# Patient Record
Sex: Female | Born: 1955 | Race: White | Hispanic: No | Marital: Married | Smoking: Former smoker
Health system: Southern US, Community
[De-identification: ages and names within clinical notes are randomized; demographics above are authoritative.]

## PROBLEM LIST (undated history)

## (undated) DIAGNOSIS — M549 Dorsalgia, unspecified: Secondary | ICD-10-CM

## (undated) DIAGNOSIS — F444 Conversion disorder with motor symptom or deficit: Secondary | ICD-10-CM

## (undated) DIAGNOSIS — F32A Depression, unspecified: Secondary | ICD-10-CM

## (undated) DIAGNOSIS — I10 Essential (primary) hypertension: Secondary | ICD-10-CM

## (undated) DIAGNOSIS — E119 Type 2 diabetes mellitus without complications: Secondary | ICD-10-CM

## (undated) DIAGNOSIS — F419 Anxiety disorder, unspecified: Secondary | ICD-10-CM

## (undated) HISTORY — PX: COSMETIC SURGERY: SHX468

## (undated) HISTORY — DX: Anxiety disorder, unspecified: F41.9

## (undated) HISTORY — PX: ABDOMINAL SURGERY: SHX537

## (undated) HISTORY — DX: Depression, unspecified: F32.A

## (undated) HISTORY — DX: Type 2 diabetes mellitus without complications: E11.9

## (undated) HISTORY — DX: Essential (primary) hypertension: I10

## (undated) HISTORY — PX: CHOLECYSTECTOMY: SHX55

## (undated) HISTORY — PX: BREAST SURGERY: SHX581

## (undated) HISTORY — PX: OTHER SURGICAL HISTORY: SHX169

---

## 1999-10-04 ENCOUNTER — Emergency Department: Admit: 1999-10-04 | Payer: Self-pay | Source: Emergency Department

## 1999-10-04 ENCOUNTER — Emergency Department: Admit: 1999-10-04 | Payer: Self-pay | Admitting: Emergency Medicine

## 2003-03-10 ENCOUNTER — Emergency Department: Admit: 2003-03-10 | Payer: Self-pay | Source: Emergency Department

## 2006-02-13 DIAGNOSIS — F331 Major depressive disorder, recurrent, moderate: Secondary | ICD-10-CM | POA: Insufficient documentation

## 2006-03-06 DIAGNOSIS — F411 Generalized anxiety disorder: Secondary | ICD-10-CM | POA: Insufficient documentation

## 2006-03-06 DIAGNOSIS — Z13220 Encounter for screening for lipoid disorders: Secondary | ICD-10-CM | POA: Insufficient documentation

## 2006-03-06 DIAGNOSIS — Z136 Encounter for screening for cardiovascular disorders: Secondary | ICD-10-CM | POA: Insufficient documentation

## 2006-12-28 DIAGNOSIS — R635 Abnormal weight gain: Secondary | ICD-10-CM | POA: Insufficient documentation

## 2007-03-26 ENCOUNTER — Emergency Department: Admit: 2007-03-26 | Payer: Self-pay | Source: Emergency Department | Admitting: Emergency Medicine

## 2007-03-26 LAB — URINALYSIS WITH MICROSCOPIC
Bilirubin, UA: NEGATIVE
Glucose, UA: NEGATIVE
Ketones UA: NEGATIVE
Leukocyte Esterase, UA: NEGATIVE
Nitrite, UA: NEGATIVE
Protein, UR: NEGATIVE
Specific Gravity UA POCT: >=1.03 (ref <=1.030)
Urine pH: 5.5 (ref 5.0–8.0)
Urobilinogen, UA: 0.2

## 2007-12-14 ENCOUNTER — Emergency Department: Admit: 2007-12-14 | Payer: Self-pay | Source: Emergency Department | Admitting: Emergency Medicine

## 2008-01-31 DIAGNOSIS — J309 Allergic rhinitis, unspecified: Secondary | ICD-10-CM | POA: Insufficient documentation

## 2009-06-24 ENCOUNTER — Emergency Department: Admit: 2009-06-24 | Payer: Self-pay | Source: Emergency Department

## 2010-10-23 ENCOUNTER — Emergency Department
Admit: 2010-10-23 | Disposition: A | Discharge status: Home / Self Care | Payer: Self-pay | Source: Emergency Department | Admitting: Emergency Medicine

## 2011-03-10 DIAGNOSIS — R42 Dizziness and giddiness: Secondary | ICD-10-CM | POA: Insufficient documentation

## 2013-12-31 ENCOUNTER — Ambulatory Visit (INDEPENDENT_AMBULATORY_CARE_PROVIDER_SITE_OTHER): Payer: No Typology Code available for payment source | Admitting: Family Medicine

## 2013-12-31 ENCOUNTER — Encounter (INDEPENDENT_AMBULATORY_CARE_PROVIDER_SITE_OTHER): Payer: Self-pay | Admitting: Family Medicine

## 2013-12-31 VITALS — BP 167/108 | HR 86 | Temp 98.4°F | Resp 16 | Ht 66.5 in | Wt 180.0 lb

## 2013-12-31 DIAGNOSIS — B373 Candidiasis of vulva and vagina: Secondary | ICD-10-CM

## 2013-12-31 DIAGNOSIS — B3731 Acute candidiasis of vulva and vagina: Secondary | ICD-10-CM

## 2013-12-31 DIAGNOSIS — N898 Other specified noninflammatory disorders of vagina: Secondary | ICD-10-CM

## 2013-12-31 LAB — VAGINAL WET PREP POCT: POCT Trich Wet Prep: NONE SEEN

## 2013-12-31 LAB — VAGINAL KOH PREP POINT OF CARE: POCT KOH Prep: POSITIVE

## 2013-12-31 MED ORDER — FLUCONAZOLE 150 MG PO TABS
150.0000 mg | ORAL_TABLET | Freq: Once | ORAL | Status: AC
Start: 2013-12-31 — End: 2013-12-31

## 2013-12-31 NOTE — Patient Instructions (Signed)
You need to follow-up your BP in next few days    Take Diflucan today     Use baby powder and keep very dry    Follow-up with PCP

## 2013-12-31 NOTE — Progress Notes (Signed)
Subjective:       Patient ID: Krista Gray is a 58 y.o. female.    HPI  No tob  H/o seb cyst in vulva   Now c itchy mild [place]  Not sex active does not desire STI testing  The following portions of the patient's history were reviewed and updated as appropriate: allergies, current medications, past family history, past medical history, past social history, past surgical history and problem list.    Review of Systems  No f/c/HAs/runny nose/ST/ear pain/gland complaints/cough/sneezing/eye discharge  Last Pap ~2 yrs ago      Objective:     Physical Exam  BP 167/108 mmHg  Pulse 86  Temp(Src) 98.4 F (36.9 C) (Tympanic)  Resp 16  Ht 1.689 m (5' 6.5")  Wt 81.647 kg (180 lb)  BMI 28.62 kg/m2  Ery vulva inflam  Some swelling  No pain c procedure  Mild [place]          Assessment:       Yeast V.       Plan:       See avs and orders       Repeat BP 163/103

## 2013-12-31 NOTE — Progress Notes (Signed)
Patient is taking Vitamin D3 and uses probiotics

## 2014-01-09 DIAGNOSIS — G4700 Insomnia, unspecified: Secondary | ICD-10-CM | POA: Insufficient documentation

## 2014-12-06 ENCOUNTER — Ambulatory Visit (INDEPENDENT_AMBULATORY_CARE_PROVIDER_SITE_OTHER): Payer: No Typology Code available for payment source | Admitting: Internal Medicine

## 2014-12-06 ENCOUNTER — Encounter (INDEPENDENT_AMBULATORY_CARE_PROVIDER_SITE_OTHER): Payer: Self-pay

## 2014-12-06 VITALS — BP 115/78 | HR 88 | Temp 97.9°F | Resp 17 | Ht 67.0 in | Wt 186.6 lb

## 2014-12-06 DIAGNOSIS — L03114 Cellulitis of left upper limb: Secondary | ICD-10-CM

## 2014-12-06 DIAGNOSIS — W57XXXA Bitten or stung by nonvenomous insect and other nonvenomous arthropods, initial encounter: Secondary | ICD-10-CM

## 2014-12-06 MED ORDER — AMOXICILLIN-POT CLAVULANATE 875-125 MG PO TABS
1.0000 | ORAL_TABLET | Freq: Two times a day (BID) | ORAL | Status: AC
Start: 2014-12-06 — End: 2014-12-16

## 2014-12-06 NOTE — Patient Instructions (Signed)
Cellulitis  You have an infection of the skin known as cellulitis. This usually starts with a scrape, cut, insect bite, blister or other opening in the skin which becomes infected. This is a serious condition. It must be watched closely to be sure the infection is not spreading.  With antibiotic treatment, the size of the red area will gradually shrink in size until the skin returns to normal. This will take 7-10 days.  The red area should never increase in size once the antibiotic medicine has been started. Occasionally, an infection will be resistant to one antibiotic and another one will have to be used.  Home Care:  1) Limit the use of the affected part, since excess movement can cause the infection to spread.  2) If the infection is on your leg, walk as little as possible during the first few days of the treatment. Keep your leg elevated while sitting. This will reduce swelling.  3) Take all of the antibiotic medicine exactly as directed until it is gone. Be careful not to miss any doses, especially during the first seven days.  Follow Up  with your doctor or this facility as directed. Check the infected area daily for the warning signs listed below.  Get Prompt Medical Attention  if any of the following occur:  -- Spreading area of redness  -- Increasing swelling or pain  -- Appearance of pus or drainage  -- Fever over 100.4 F (38.0 C) oral, or over 101.4 F (38.6 C) rectal, after two days on antibiotics   2000-2015 [employer] [address], [place] [address]. All rights reserved. This information is not intended as a substitute for professional medical care. Always follow your healthcare professional's instructions.

## 2014-12-06 NOTE — Progress Notes (Signed)
Subjective:       Patient ID: Krista Gray [place] is a 59 y.o. female.    HPI    Chief Complaint   Patient presents with   . Insect Bite     12/03/2014: Felt a tickle, scratched surface 12/04/2014: became swollen with redness and progressivly gotten worse 0730/2016: cortoze 10 topical application: some relief; tender to touch at the eye of rash & warm to touch,      Thinks she got a bug bite (not a sting, did not hurt) left forearm 2 days ago, became swollen and a little red yesterday, redness spread some today.  Some itching but no pain.  No drainage or bleeding from area of bite.  Did not see the bug that bit her.  No dyspnea or wheezing.  No lip or tongue swelling.  No known allergies.       Past Medical History   Diagnosis Date   . Hypertension    . Anxiety    . Depression    . Diabetes mellitus      Current Outpatient Prescriptions   Medication Sig Dispense Refill   . FLUoxetine (PROZAC) 20 MG capsule Take 20 mg by mouth daily.     . metFORMIN (GLUCOPHAGE) 850 MG tablet Take 500 mg by mouth 2 (two) times daily with meals.     . phentermine (ADIPEX-P) 37.5 MG tablet Take 37.5 mg by mouth every morning before breakfast.     . traZODone (DESYREL) 50 MG tablet Take 50 mg by mouth nightly.     Marland Kitchen UNABLE TO FIND Med Name: Patient states water pill       No current facility-administered medications for this visit.     Allergies   Allergen Reactions   . Other      MSG     Social History     Social History   . Marital Status: Married     Spouse Name: N/A   . Number of Children: N/A   . Years of Education: N/A     Occupational History   . Not on file.     Social History Main Topics   . Smoking status: Former Games developer   . Smokeless tobacco: Never Used   . Alcohol Use: No   . Drug Use: No   . Sexual Activity: Not on file     Other Topics Concern   . Not on file     Social History Narrative         Review of Systems  See hpi      Objective:     Physical Exam   Constitutional: She is oriented to person, place, and time. No  distress.   HENT:   No lip or tongue swelling   Eyes: Conjunctivae are normal. No scleral icterus.   Pulmonary/Chest: Effort normal.   Musculoskeletal:        Arms:  Neurological: She is alert and oriented to person, place, and time.   Skin: Skin is warm and dry. She is not diaphoretic.   Psychiatric: She has a normal mood and affect. Her speech is normal and behavior is normal.     BP 115/78 mmHg  Pulse 88  Temp(Src) 97.9 F (36.6 Gray) (Oral)  Resp 17  Ht 1.702 m (5\' 7" )  Wt 84.641 kg (186 lb 9.6 oz)  BMI 29.22 kg/m2  SpO2 97%        Assessment:       1. Cellulitis of left upper extremity  amoxicillin-clavulanate (AUGMENTIN) 875-125 MG per tablet   2. Insect bite  amoxicillin-clavulanate (AUGMENTIN) 875-125 MG per tablet           Plan:       Reviewed AVS in detail with patient.  Return prn.

## 2016-05-04 ENCOUNTER — Other Ambulatory Visit: Payer: Self-pay | Admitting: Family Medicine

## 2016-05-04 DIAGNOSIS — N649 Disorder of breast, unspecified: Secondary | ICD-10-CM

## 2016-05-05 ENCOUNTER — Ambulatory Visit: Payer: No Typology Code available for payment source | Attending: Family Medicine

## 2016-05-05 DIAGNOSIS — T8549XA Other mechanical complication of breast prosthesis and implant, initial encounter: Secondary | ICD-10-CM | POA: Insufficient documentation

## 2016-05-05 DIAGNOSIS — N649 Disorder of breast, unspecified: Secondary | ICD-10-CM

## 2016-05-05 MED ORDER — GADOBUTROL 1 MMOL/ML IV SOLN
8.5000 mL | Freq: Once | INTRAVENOUS | Status: AC | PRN
Start: 2016-05-05 — End: 2016-05-05
  Administered 2016-05-05: 8.5 mmol via INTRAVENOUS
  Filled 2016-05-05: qty 10

## 2016-06-23 ENCOUNTER — Ambulatory Visit: Payer: Self-pay

## 2016-06-28 LAB — LAB USE ONLY - HISTORICAL SURGICAL PATHOLOGY

## 2016-10-20 ENCOUNTER — Other Ambulatory Visit (INDEPENDENT_AMBULATORY_CARE_PROVIDER_SITE_OTHER): Payer: Self-pay

## 2016-10-21 LAB — CBC AND DIFFERENTIAL
Baso(Absolute): 59 cells/uL (ref 0–200)
Basophils: 0.9 %
Eosinophils Absolute: 205 cells/uL (ref 15–500)
Eosinophils: 3.1 %
Hematocrit: 44.2 % (ref 35.0–45.0)
Hemoglobin: 14.5 g/dL (ref 11.7–15.5)
Lymphocytes Absolute: 2488 cells/uL (ref 850–3900)
Lymphocytes: 37.7 %
MCH: 28.9 pg (ref 27.0–33.0)
MCHC: 32.8 g/dL (ref 32.0–36.0)
MCV: 88 fL (ref 80.0–100.0)
MPV: 9.9 fL (ref 7.5–12.5)
Monocytes Absolute: 673 cells/uL (ref 200–950)
Monocytes: 10.2 %
Neutrophils Absolute: 3175 cells/uL (ref 1500–7800)
Neutrophils: 48.1 %
Platelets: 261 10*3/uL (ref 140–400)
RBC: 5.02 10*6/uL (ref 3.80–5.10)
RDW: 14.3 % (ref 11.0–15.0)
WBC: 6.6 10*3/uL (ref 3.8–10.8)

## 2016-10-21 LAB — COMPREHENSIVE METABOLIC PANEL
ALT: 23 U/L (ref 6–29)
AST (SGOT): 17 U/L (ref 10–35)
Albumin/Globulin Ratio: 2 (calc) (ref 1.0–2.5)
Albumin: 4.3 g/dL (ref 3.6–5.1)
Alkaline Phosphatase: 71 U/L (ref 33–130)
BUN: 14 mg/dL (ref 7–25)
Bilirubin, Total: 0.4 mg/dL (ref 0.2–1.2)
CO2: 27 mmol/L (ref 20–31)
Calcium: 9.6 mg/dL (ref 8.6–10.4)
Chloride: 106 mmol/L (ref 98–110)
Creatinine: 0.76 mg/dL (ref 0.50–1.05)
EGFR African American: 100 mL/min/{1.73_m2} (ref >=60)
Globulin: 2.1 g/dL (calc) (ref 1.9–3.7)
Glucose: 96 mg/dL (ref 65–99)
NON-AFRICAN AMERICA EGFR: 86 mL/min/{1.73_m2} (ref >=60)
Potassium: 4.3 mmol/L (ref 3.5–5.3)
Protein, Total: 6.4 g/dL (ref 6.1–8.1)
Sodium: 141 mmol/L (ref 135–146)

## 2016-10-21 LAB — LIPID PANEL
Cholesterol / HDL Ratio: 2.3 (calc) (ref <5.0)
Cholesterol: 195 mg/dL (ref <200)
HDL: 83 mg/dL (ref >50)
LDL Calculated: 97 mg/dL (calc)
NON HDL CHOLESTEROL: 112 mg/dL (calc) (ref <130)
Triglycerides: 61 mg/dL (ref <150)

## 2016-10-21 LAB — TSH: TSH: 1.65 mIU/L (ref 0.40–4.50)

## 2017-04-20 ENCOUNTER — Observation Stay
Admission: AD | Admit: 2017-04-20 | Discharge: 2017-04-21 | Disposition: A | Discharge status: Home / Self Care | Payer: Commercial Managed Care - POS | Source: Ambulatory Visit | Attending: Family Medicine | Admitting: Family Medicine

## 2017-04-20 ENCOUNTER — Emergency Department: Payer: Commercial Managed Care - POS

## 2017-04-20 ENCOUNTER — Emergency Department
Admission: EM | Admit: 2017-04-20 | Discharge: 2017-04-20 | Disposition: A | Discharge status: Other Acute Inpatient Hospital | Payer: Commercial Managed Care - POS | Attending: Emergency Medicine | Admitting: Emergency Medicine

## 2017-04-20 DIAGNOSIS — I10 Essential (primary) hypertension: Secondary | ICD-10-CM | POA: Insufficient documentation

## 2017-04-20 DIAGNOSIS — N289 Disorder of kidney and ureter, unspecified: Secondary | ICD-10-CM

## 2017-04-20 DIAGNOSIS — Z9889 Other specified postprocedural states: Secondary | ICD-10-CM | POA: Insufficient documentation

## 2017-04-20 DIAGNOSIS — F32A Depression, unspecified: Secondary | ICD-10-CM

## 2017-04-20 DIAGNOSIS — D649 Anemia, unspecified: Secondary | ICD-10-CM

## 2017-04-20 DIAGNOSIS — R0902 Hypoxemia: Secondary | ICD-10-CM

## 2017-04-20 DIAGNOSIS — N179 Acute kidney failure, unspecified: Secondary | ICD-10-CM | POA: Insufficient documentation

## 2017-04-20 DIAGNOSIS — Z87891 Personal history of nicotine dependence: Secondary | ICD-10-CM | POA: Insufficient documentation

## 2017-04-20 DIAGNOSIS — Z6832 Body mass index (BMI) 32.0-32.9, adult: Secondary | ICD-10-CM | POA: Insufficient documentation

## 2017-04-20 DIAGNOSIS — R339 Retention of urine, unspecified: Principal | ICD-10-CM | POA: Insufficient documentation

## 2017-04-20 DIAGNOSIS — E669 Obesity, unspecified: Secondary | ICD-10-CM | POA: Insufficient documentation

## 2017-04-20 DIAGNOSIS — E119 Type 2 diabetes mellitus without complications: Secondary | ICD-10-CM | POA: Insufficient documentation

## 2017-04-20 LAB — CBC AND DIFFERENTIAL
Absolute NRBC: 0 10*3/uL
Basophils Absolute Automated: 0.02 10*3/uL (ref 0.00–0.20)
Basophils Automated: 0.2 %
Eosinophils Absolute Automated: 0.03 10*3/uL (ref 0.00–0.70)
Eosinophils Automated: 0.3 %
Hematocrit: 30.1 % — ABNORMAL LOW (ref 37.0–47.0)
Hgb: 9.9 g/dL — ABNORMAL LOW (ref 12.0–16.0)
Immature Granulocytes Absolute: 0.05 10*3/uL
Immature Granulocytes: 0.5 %
Lymphocytes Absolute Automated: 1.62 10*3/uL (ref 0.50–4.40)
Lymphocytes Automated: 15.4 %
MCH: 29.9 pg (ref 28.0–32.0)
MCHC: 32.9 g/dL (ref 32.0–36.0)
MCV: 90.9 fL (ref 80.0–100.0)
MPV: 9.9 fL (ref 9.4–12.3)
Monocytes Absolute Automated: 1.26 10*3/uL — ABNORMAL HIGH (ref 0.00–1.20)
Monocytes: 12 %
Neutrophils Absolute: 7.56 10*3/uL (ref 1.80–8.10)
Neutrophils: 71.6 %
Nucleated RBC: 0 /100 WBC (ref 0.0–1.0)
Platelets: 211 10*3/uL (ref 140–400)
RBC: 3.31 10*6/uL — ABNORMAL LOW (ref 4.20–5.40)
RDW: 15 % (ref 12–15)
WBC: 10.54 10*3/uL (ref 3.50–10.80)

## 2017-04-20 LAB — COMPREHENSIVE METABOLIC PANEL
ALT: 31 U/L (ref 0–55)
AST (SGOT): 33 U/L (ref 5–34)
Albumin/Globulin Ratio: 1.5 (ref 0.9–2.2)
Albumin: 3.4 g/dL — ABNORMAL LOW (ref 3.5–5.0)
Alkaline Phosphatase: 57 U/L (ref 37–106)
Anion Gap: 11 (ref 5.0–15.0)
BUN: 38.6 mg/dL — ABNORMAL HIGH (ref 7.0–19.0)
Bilirubin, Total: 0.7 mg/dL (ref 0.2–1.2)
CO2: 22 mEq/L (ref 22–29)
Calcium: 8.7 mg/dL (ref 8.5–10.5)
Chloride: 99 mEq/L — ABNORMAL LOW (ref 100–111)
Creatinine: 1.5 mg/dL — ABNORMAL HIGH (ref 0.6–1.0)
Globulin: 2.3 g/dL (ref 2.0–3.6)
Glucose: 132 mg/dL — ABNORMAL HIGH (ref 70–100)
Potassium: 5.2 mEq/L — ABNORMAL HIGH (ref 3.5–5.1)
Protein, Total: 5.7 g/dL — ABNORMAL LOW (ref 6.0–8.3)
Sodium: 132 mEq/L — ABNORMAL LOW (ref 136–145)

## 2017-04-20 LAB — URINALYSIS
Bilirubin, UA: NEGATIVE
Blood, UA: NEGATIVE
Glucose, UA: NEGATIVE
Ketones UA: NEGATIVE
Leukocyte Esterase, UA: NEGATIVE
Nitrite, UA: NEGATIVE
Protein, UR: NEGATIVE
Specific Gravity UA: >1.03 (ref 1.001–1.035)
Urine pH: 6 (ref 5.0–8.0)
Urobilinogen, UA: 0.2 mg/dL

## 2017-04-20 LAB — GFR: EGFR: 35.5

## 2017-04-20 MED ORDER — HEPARIN SODIUM (PORCINE) 5000 UNIT/ML IJ SOLN
5000.00 [IU] | Freq: Three times a day (TID) | INTRAMUSCULAR | Status: DC
Start: 2017-04-21 — End: 2017-04-21
  Administered 2017-04-21: 05:00:00 5000 [IU] via SUBCUTANEOUS
  Filled 2017-04-20: qty 1

## 2017-04-20 MED ORDER — PATIENT SUPPLIED NON FORMULARY
500.00 mg | Freq: Two times a day (BID) | Status: DC
Start: 2017-04-21 — End: 2017-04-21

## 2017-04-20 MED ORDER — FLUOXETINE HCL 20 MG PO CAPS
40.00 mg | ORAL_CAPSULE | Freq: Every day | ORAL | Status: DC
Start: 2017-04-21 — End: 2017-04-21
  Administered 2017-04-21: 09:00:00 40 mg via ORAL
  Filled 2017-04-20: qty 2

## 2017-04-20 MED ORDER — LACTATED RINGERS IV BOLUS
1000.00 mL | Freq: Once | INTRAVENOUS | Status: AC
Start: 2017-04-21 — End: 2017-04-21
  Administered 2017-04-21: 01:00:00 1000 mL via INTRAVENOUS

## 2017-04-20 MED ORDER — OXYCODONE HCL 5 MG PO TABS
5.00 mg | ORAL_TABLET | ORAL | Status: DC | PRN
Start: 2017-04-20 — End: 2017-04-21
  Administered 2017-04-20 – 2017-04-21 (×2): 5 mg via ORAL
  Filled 2017-04-20 (×3): qty 1

## 2017-04-20 MED ORDER — MORPHINE SULFATE 4 MG/ML IJ/IV SOLN (WRAP)
4.0000 mg | Freq: Once | Status: DC
Start: 2017-04-20 — End: 2017-04-20

## 2017-04-20 MED ORDER — ENOXAPARIN SODIUM 40 MG/0.4ML SC SOLN
40.00 mg | Freq: Every day | SUBCUTANEOUS | Status: DC
Start: 2017-04-21 — End: 2017-04-20

## 2017-04-20 MED ORDER — PATIENT SUPPLIED NON FORMULARY
500.00 mg | Freq: Two times a day (BID) | Status: DC
Start: 2017-04-21 — End: 2017-04-20

## 2017-04-20 MED ORDER — SODIUM CHLORIDE 0.9 % IV BOLUS
1000.0000 mL | Freq: Once | INTRAVENOUS | Status: AC
Start: 2017-04-20 — End: 2017-04-20
  Administered 2017-04-20: 1000 mL via INTRAVENOUS

## 2017-04-20 MED ORDER — ACETAMINOPHEN 325 MG PO TABS
650.00 mg | ORAL_TABLET | Freq: Four times a day (QID) | ORAL | Status: DC | PRN
Start: 2017-04-20 — End: 2017-04-21
  Administered 2017-04-21: 650 mg via ORAL
  Filled 2017-04-20: qty 2

## 2017-04-20 MED ORDER — ONDANSETRON HCL 4 MG/2ML IJ SOLN
4.00 mg | Freq: Three times a day (TID) | INTRAMUSCULAR | Status: DC
Start: 2017-04-21 — End: 2017-04-21

## 2017-04-20 MED ORDER — MORPHINE SULFATE 2 MG/ML IJ/IV SOLN (WRAP)
INTRAVENOUS | Status: AC
Start: 2017-04-20 — End: 2017-04-20
  Administered 2017-04-20: 4 mg
  Filled 2017-04-20: qty 2

## 2017-04-20 MED ORDER — ONDANSETRON HCL 4 MG PO TABS
4.00 mg | ORAL_TABLET | Freq: Three times a day (TID) | ORAL | Status: DC
Start: 2017-04-21 — End: 2017-04-21

## 2017-04-20 MED ORDER — NALOXONE HCL 0.4 MG/ML IJ SOLN (WRAP)
0.20 mg | INTRAMUSCULAR | Status: DC | PRN
Start: 2017-04-20 — End: 2017-04-21

## 2017-04-20 MED ORDER — TRAZODONE HCL 50 MG PO TABS
50.00 mg | ORAL_TABLET | Freq: Every evening | ORAL | Status: DC
Start: 2017-04-21 — End: 2017-04-21

## 2017-04-20 NOTE — H&P (Shared)
[facility] Practice Admission H&P  Physician Available 24 hours a day:  [place] Resident: [phone]  [place] Resident: [phone]  Office#: [phone]       Date Time: 04/20/17 5:40 PM  Patient Name: Krista Gray  Attending Physician: Rob Bunting, MD  Primary Care Physician: Lanell Persons, MD    CC: Urinary retention       Assessment:     Active Hospital Problems    Diagnosis   . Urinary retention   . Depression   . Obese       61 y.o. female PMHx significant for anxiety/depression, DM, HTN and obesity admitted with urinary retention and hypoxia after a tummy tuck.      Plan:   ***    1)  2) Chronic:       #FEN/GI     #PPX  - DVT  - GI    CODE STATUS: ***    History of Presenting Illness:   Krista Gray is a 61 y.o. female PMHx significant for *** who presents to the hospital with ***    ED COURSE:   VS:   LABS:   EKG:   IMAGING:   MEDS:    SPECIALISTS:          Past Medical History:     Past Medical History:   Diagnosis Date   . Anxiety    . Depression    . Diabetes mellitus    . Hypertension        Past Surgical History:     Past Surgical History:   Procedure Laterality Date   . ABDOMINAL SURGERY     . COSMETIC SURGERY         Family History:     Family History   Problem Relation Age of Onset   . Diabetes Mother    . Hypertension Mother    . Osteoporosis Mother        Social History:     Social History     Social History   . Marital status: Married     Spouse name: N/A   . Number of children: N/A   . Years of education: N/A     Social History Main Topics   . Smoking status: Former Games developer   . Smokeless tobacco: Never Used   . Alcohol use No   . Drug use: No   . Sexual activity: Not on file     Other Topics Concern   . Not on file     Social History Narrative   . No narrative on file       Allergies:     Allergies   Allergen Reactions   . Other      MSG       Medications:     Current/Home Medications    CEFADROXIL (DURICEF) 500 MG CAPSULE    TAKE ONE CAPSULE TWICE A DAY BY MOUTH  STARTING NIGHT OF SURGERY    FLUOXETINE (PROZAC) 20 MG CAPSULE    Take 20 mg by mouth daily.    ONDANSETRON (ZOFRAN-ODT) 8 MG DISINTEGRATING TABLET    PLEASE SEE ATTACHED FOR DETAILED DIRECTIONS    OXYCODONE-ACETAMINOPHEN (LYNOX) 10-300 MG PER TABLET    Take 1 tablet by mouth every 4 (four) hours as needed for Pain.    PHENTERMINE (ADIPEX-P) 37.5 MG TABLET    Take 37.5 mg by mouth every morning before breakfast.    TRAZODONE (DESYREL) 50 MG TABLET  Take 50 mg by mouth nightly.    UNABLE TO FIND    Med Name: Patient states water pill        Review of Systems:   All other systems were reviewed and are negative except: per HPI    Physical Exam:     VITAL SIGNS: Physical Exam:   Temp:  [98.6 F (37 C)] 98.6 F (37 C)  Heart Rate:  [89-114] 89  Resp Rate:  [16] 16  BP: (123-129)/(62-70) 123/62  Body mass index is 30.67 kg/m.    Intake/Output Summary (Last 24 hours) at 04/20/17 1740  Last data filed at 04/20/17 1701   Gross per 24 hour   Intake                0 ml   Output              750 ml   Net             -750 ml    GEN: alert and oriented x 3; NAD  HEENT: PERRL, EOMI, sclera anicteric, oropharynx clear without lesions, MMM  NECK: supple, no lymphadenopathy, no thyromegaly, no JVD, no bruits  CARDS: RRR, no murmurs, rubs or gallops  LUNGS: clear to auscultation bilaterally, without wheezing, rhonchi, or rales  ABD: soft, non-tender, non-distended; no palpable masses, no hepatosplenomegaly, normoactive bowel sounds, no rebound or guarding  EXT: no clubbing, cyanosis, or edema  NEURO: cranial nerves grossly intact, strength 5/5 in upper and lower extremities, sensation intact  SKIN: no rashes or lesions noted  ***           Labs Reviewed:     Results     Procedure Component Value Units Date/Time    Comprehensive metabolic panel [[id number]]  (Abnormal) Collected:  04/20/17 1550    Specimen:  Blood Updated:  04/20/17 1619     Glucose 132 (H) mg/dL      BUN 45.4 (H) mg/dL      Creatinine 1.5 (H) mg/dL      Sodium 098  (L) mEq/L      Potassium 5.2 (H) mEq/L      Chloride 99 (L) mEq/L      CO2 22 mEq/L      Calcium 8.7 mg/dL      Protein, Total 5.7 (L) g/dL      Albumin 3.4 (L) g/dL      AST (SGOT) 33 U/L      ALT 31 U/L      Alkaline Phosphatase 57 U/L      Bilirubin, Total 0.7 mg/dL      Globulin 2.3 g/dL      Albumin/Globulin Ratio 1.5     Anion Gap 11.0    GFR [[id number]] Collected:  04/20/17 1550     Updated:  04/20/17 1619     EGFR 35.5    UA with reflex to micro (pts  3 + yrs) [[id number]] Collected:  04/20/17 1605    Specimen:  Urine from Urine, Clean Catch Updated:  04/20/17 1613     Urine Type Catheterized, F     Color, UA Yellow     Clarity, UA Clear     Specific Gravity UA >1.030     Urine pH 6.0     Leukocyte Esterase, UA Negative     Nitrite, UA Negative     Protein, UR Negative     Glucose, UA Negative     Ketones UA Negative     Urobilinogen, UA  0.2 mg/dL      Bilirubin, UA Negative     Blood, UA Negative    CBC with differential [[id number]]  (Abnormal) Collected:  04/20/17 1550    Specimen:  Blood from Blood Updated:  04/20/17 1602     WBC 10.54 x10 3/uL      Hgb 9.9 (L) g/dL      Hematocrit 45.4 (L) %      Platelets 211 x10 3/uL      RBC 3.31 (L) x10 6/uL      MCV 90.9 fL      MCH 29.9 pg      MCHC 32.9 g/dL      RDW 15 %      MPV 9.9 fL      Neutrophils 71.6 %      Lymphocytes Automated 15.4 %      Monocytes 12.0 %      Eosinophils Automated 0.3 %      Basophils Automated 0.2 %      Immature Granulocyte 0.5 %      Nucleated RBC 0.0 /100 WBC      Neutrophils Absolute 7.56 x10 3/uL      Abs Lymph Automated 1.62 x10 3/uL      Abs Mono Automated 1.26 (H) x10 3/uL      Abs Eos Automated 0.03 x10 3/uL      Absolute Baso Automated 0.02 x10 3/uL      Absolute Immature Granulocyte 0.05 x10 3/uL      Absolute NRBC 0.00 x10 3/uL             Imaging Reviewed:     Xr Chest  Ap Portable    Result Date: 04/20/2017  No acute cardiopulmonary disease. Mickel Duhamel, MD 04/20/2017 4:55 PM        Signed by: Ignacia Bayley, MD   [place]:  [phone]  [place]: [phone]  Office #: [phone]     MW:[id number], Barbara Cower, MD

## 2017-04-20 NOTE — ED Notes (Signed)
pts ETA 1915

## 2017-04-20 NOTE — ED Provider Notes (Signed)
[name] H&P         CLINICAL SUMMARY          Diagnosis:    .     Final diagnoses:   Urinary retention   Renal insufficiency   Hypoxia   Anemia, unspecified type         MDM Notes:      Krista Gray is a 61 y.o. female with Acute urinary retention 1 day postop from abdominoplasty surgery.  Patient was also mildly hypoxic anemic and with moderate renal insufficiency, presumably due to dehydration.  Patient was admitted for IV hydration and further evaluation of hypoxia.      Disposition:         Inpatient Admit      ED Disposition     ED Disposition Condition Date/Time Comment    Admit to [facility]  Fri Apr 20, 2017  7:17 PM Accepting physician: Dr. Omar Person.          CASE ACUITY SUMMARY        Kidney problems~~~~~~~~~~  Acute Kidney injury (creatinine > 0.3 above baseline or 1.5 x baseline)    Respiratory problems~~~~~~~  Acute respiratory insufficiency manifested by:  Hypoxia      New Prescriptions    No medications on file                 CLINICAL INFORMATION        HPI:      Chief Complaint: Urinary Retention  .    Krista Gray [place] is a 62 y.o. female who  has a past medical history of Anxiety; Depression; Diabetes mellitus; and Hypertension. and  has a past surgical history that includes Abdominal surgery and Cosmetic surgery. who presents with urinary retention s/p abdominoplasty done yesterday. Pt notes she has not been able to urinate. Denies CP, Sob, fever or chills. Notes she is shaky. currently taking percocet. Sx moderate, constant.       History obtained from: Patient      ROS:      Positive and negative ROS elements as per HPI.  All other systems reviewed and negative.      Physical Exam:      Pulse (!) 114  BP 129/70  Resp 16  SpO2 93 %  Temp 98.6 F (37 Gray)    Constitutional: Vital signs reviewed. Tremulous.   Head: Normocephalic, atraumatic  Eyes: No conjunctival injection. No discharge.EOMI  ENT: Mucous membranes moist,Ears and throat wnl  Neck:  Normal range of motion. Non-tender.  Respiratory/Chest: Clear to auscultation. No respiratory distress.   Cardiovascular: Tachycardic rate and rhythm. No murmur.   Abdomen: Soft. No guarding. No masses or hepatosplenomegaly. 2 suprapubic catheters in place with bloody drainage. Erythema and bruising throughout abdomen extending to medial thighs proximally.  Genitourinary:   UpperExtremity:FROM,no abnormality noted  LowerExtremity: No edema. No cyanosis.FROM  Neurological: No focal motor deficits by observation. Speech normal. Memory normal.  Skin: Warm and dry. No rash.  Lymphatic: No cervical lymphadenopathy.  Psychiatric: Normal affect. Normal concentration.  Back  No CVAT,no abnormalities,  Full range of motion              PAST HISTORY        Primary Care Provider: Lanell Persons, MD        PMH/PSH:    .     Past Medical History:   Diagnosis Date   . Anxiety    . Depression    . Diabetes mellitus    .  Hypertension        She has a past surgical history that includes Abdominal surgery and Cosmetic surgery.      Social/Family History:      She reports that she has quit smoking. She has never used smokeless tobacco. She reports that she does not drink alcohol or use drugs.    Family History   Problem Relation Age of Onset   . Diabetes Mother    . Hypertension Mother    . Osteoporosis Mother          Listed Medications on Arrival:    .     Previous Medications    CEFADROXIL (DURICEF) 500 MG CAPSULE    TAKE ONE CAPSULE TWICE A DAY BY MOUTH STARTING NIGHT OF SURGERY    FLUOXETINE (PROZAC) 20 MG CAPSULE    Take 20 mg by mouth daily.    ONDANSETRON (ZOFRAN-ODT) 8 MG DISINTEGRATING TABLET    PLEASE SEE ATTACHED FOR DETAILED DIRECTIONS    OXYCODONE-ACETAMINOPHEN (LYNOX) 10-300 MG PER TABLET    Take 1 tablet by mouth every 4 (four) hours as needed for Pain.    PHENTERMINE (ADIPEX-P) 37.5 MG TABLET    Take 37.5 mg by mouth every morning before breakfast.    TRAZODONE (DESYREL) 50 MG TABLET    Take 50 mg by mouth nightly.     UNABLE TO FIND    Med Name: Patient states water pill      Allergies: She is allergic to other.            VISIT INFORMATION        Clinical Course in the ED:      ED Course as of Apr 20 1804   Fri Apr 20, 2017   1651 [facility] resident paged  [MU]   1733 Bed requested with Clydie Braun an [place] fair Thelma Barge  [MU]      ED Course User Index  [MU] Rob Bunting, MD   4:43 PM  Spoke with Dr. Paulina Fusi' office.   On-call physician will call back.    4:46 PM  Dr. Paulina Fusi agrees to admission.     5:30 PM  Spoke with [facility] resident, Dr. Annice Needy.   Will admit to med tele at [facility].      Medications Given in the ED:    .     ED Medication Orders     Start Ordered     Status Ordering Provider    04/20/17 1753 04/20/17 1752  morphine injection 4 mg  Once     Route: Intravenous  Ordered Dose: 4 mg     Last MAR action:  Not Given Cathi Roan A    04/20/17 1752 04/20/17 1752  morphine 2 mg/mL injection     Comments:  Created by cabinet override    Last MAR action:  Given     04/20/17 1547 04/20/17 1546  sodium chloride 0.9 % bolus 1,000 mL  Once     Route: Intravenous  Ordered Dose: 1,000 mL     Last MAR action:  New Bag Viviene Thurston A            Procedures:      Procedures      Interpretations:      O2 sat-           saturation: 93 %; Oxygen use: 3L NC; Interpretation: Hypoxic    Radiology -     interpreted by me with the following observations: CXR: NAD  RESULTS        Recent Lab Results:      Results     Procedure Component Value Units Date/Time    Comprehensive metabolic panel [[id number]]  (Abnormal) Collected:  04/20/17 1550    Specimen:  Blood Updated:  04/20/17 1619     Glucose 132 (H) mg/dL      BUN 62.1 (H) mg/dL      Creatinine 1.5 (H) mg/dL      Sodium 308 (L) mEq/L      Potassium 5.2 (H) mEq/L      Chloride 99 (L) mEq/L      CO2 22 mEq/L      Calcium 8.7 mg/dL      Protein, Total 5.7 (L) g/dL      Albumin 3.4 (L) g/dL      AST (SGOT) 33 U/L      ALT 31 U/L      Alkaline Phosphatase 57 U/L      Bilirubin, Total 0.7 mg/dL      Globulin 2.3  g/dL      Albumin/Globulin Ratio 1.5     Anion Gap 11.0    GFR [[id number]] Collected:  04/20/17 1550     Updated:  04/20/17 1619     EGFR 35.5    UA with reflex to micro (pts  3 + yrs) [[id number]] Collected:  04/20/17 1605    Specimen:  Urine from Urine, Clean Catch Updated:  04/20/17 1613     Urine Type Catheterized, F     Color, UA Yellow     Clarity, UA Clear     Specific Gravity UA >1.030     Urine pH 6.0     Leukocyte Esterase, UA Negative     Nitrite, UA Negative     Protein, UR Negative     Glucose, UA Negative     Ketones UA Negative     Urobilinogen, UA 0.2 mg/dL      Bilirubin, UA Negative     Blood, UA Negative    CBC with differential [[id number]]  (Abnormal) Collected:  04/20/17 1550    Specimen:  Blood from Blood Updated:  04/20/17 1602     WBC 10.54 x10 3/uL      Hgb 9.9 (L) g/dL      Hematocrit 02.7 (L) %      Platelets 211 x10 3/uL      RBC 3.31 (L) x10 6/uL      MCV 90.9 fL      MCH 29.9 pg      MCHC 32.9 g/dL      RDW 15 %      MPV 9.9 fL      Neutrophils 71.6 %      Lymphocytes Automated 15.4 %      Monocytes 12.0 %      Eosinophils Automated 0.3 %      Basophils Automated 0.2 %      Immature Granulocyte 0.5 %      Nucleated RBC 0.0 /100 WBC      Neutrophils Absolute 7.56 x10 3/uL      Abs Lymph Automated 1.62 x10 3/uL      Abs Mono Automated 1.26 (H) x10 3/uL      Abs Eos Automated 0.03 x10 3/uL      Absolute Baso Automated 0.02 x10 3/uL      Absolute Immature Granulocyte 0.05 x10 3/uL      Absolute NRBC 0.00 x10 3/uL  Radiology Results:      XR Chest  AP Portable   Final Result      No acute cardiopulmonary disease.      Mickel Duhamel, MD    04/20/2017 4:55 PM                  Scribe Attestation:      I was acting as a Neurosurgeon for Rob Bunting, MD on [employer] Gray  Treatment Team: Scribe: Ardeth Perfect, Go Sammie Bench    I am the first provider for this patient and I personally performed the services documented. Treatment Team: Scribe: Ardeth Perfect, Go Sammie Bench is scribing for me on [place]ace[place],Dorleen Gray. This  note and the patient instructions accurately reflect work and decisions made by me.  Rob Bunting, MD     *This note was generated by the Epic EMR system/ Dragon speech recognition and may contain inherent errors or omissions not intended by the user. Grammatical errors, random word insertions, deletions, pronoun errors and incomplete sentences are occasional consequences of this technology due to software limitations. Not all errors are caught or corrected. If there are questions or concerns about the content of this note or information contained within the body of this dictation they should be addressed directly with the author for clarification.                       Rob Bunting, MD  04/21/17 [phone]

## 2017-04-20 NOTE — H&P (Signed)
[facility] Practice Admission H&P  Physician Available 24 hours a day:  [place] Resident: [phone]  [place] Resident: [phone]  Office#: [phone]       Date Time: 04/20/17 8:28 PM  Patient Name: Krista Gray  Attending Physician: Doyle Askew, MD  Primary Care Physician: Lanell Persons, MD    CC: Urinary retention       Assessment:     Active Hospital Problems    Diagnosis   . Urinary retention   . Depression   . AKI (acute kidney injury)       61 y.o. female PMHx significant for depression, obesity, recent cosmetic abdomnoplasty, liposuction of back, legs, buttocks 04/19/17 admitted with Urinary retention, s/p foley placement in ER, and AKI.     Plan:     #AKI likely 2/2 obstructive (urinary retention can be opiod induced) versus prerenal  - s/p foley  - 1L LR bolus now  - BMP daily  - Pain control: Tylenol, PO oxycodone   - Would plan for void trial in AM, if pass, can likely [place] home    #Noted "crunchiness" palpated on R abdominal wall - crepitus versus expected post-surgical changes. With appropriate post-surgical changes.   - Since patient appears clinically nontoxic, with normal vitals, will hold on further imaging at this point  - Even if there is some subcutaneous air, more likely expected post-abdomnoplasty changes rather than perforated bowel.   - Can wear abdominal binder if patient can tolerate.     2) Chronic:   Depression - fluoxetine 40mg  daily, trazodone 75mg  qPM   Hx of shingles - not active now   Post surgical ppx - cont. Cefadroxil (husband will bring from home)    #FEN/GI   - 1L LR bolus now, then PO fluids, replete lytes, Diet regular    #PPX  - DVT: SCDs, SQH  - GI: Not indicated    CODE STATUS: FULL    DISPO:  Based on the information available on the day of this admission, patient will be admitted to service status:  Observation:patient is stable and improving.   Anticipated medical stability for discharge:Yellow - maybe tomorrow  Anticipated discharge  needs: pass Void trial, improving AKI    History of Presenting Illness:   Krista Gray is a 61 y.o. female PMHx significant for  depression, obesity, recent cosmetic abdomnoplasty and liposuction 04/19/17 admitted with Urinary retention, and shakiness. Patient underwent 5 hour procedure yesterday where she had a "tummy tuck", liposuction to back, inner and outer thighs, and buttocks bilaterally. Patient discharged home that day with percocet, of which she states she has taken about 4-6 tablets of in past 24 hours. +flatus, no BM yet (due to pain). Denies significant nausea, no vomiting. Decreased appetite. Denies fevers, chills. Feels she looks a little pale. Otherwise, ambulating without difficulty. The next day at 2pm (day of admission), she noticed she had not been able to urinate, had bladder distention, so she called her surgeon Dr. Paulina Fusi, who advised her to go to the ER for a catheter placement. In the ER, she was found to have a mild AKI, so was admitted. Currently, she no longer feels bladder distention, still with mild tremor that goes away when distracted.     ED COURSE:   VS: AF, HR 114, now 91, 92% on 2L, HDS   LABS: WBC 10.5, Hg 9.9, Cr 1.5, Na 132, K 5.2, wnl LFTs, normal UA   EKG: n/a    IMAGING: CXR  wnl   MEDS: 4mg  IV morphine, 1L NS bolus    SPECIALISTS:   Dr. Paulina Fusi - Plastic surgeon       Past Medical History:     Past Medical History:   Diagnosis Date   . Anxiety    . Depression    . Diabetes mellitus    . Hypertension        Past Surgical History:     Past Surgical History:   Procedure Laterality Date   . ABDOMINAL SURGERY     . COSMETIC SURGERY         Family History:     Family History   Problem Relation Age of Onset   . Diabetes Mother    . Hypertension Mother    . Osteoporosis Mother        Social History:     Social History     Social History   . Marital status: Married     Spouse name: N/A   . Number of children: N/A   . Years of education: N/A     Social History Main Topics   .  Smoking status: Former Games developer   . Smokeless tobacco: Never Used   . Alcohol use No   . Drug use: No   . Sexual activity: Not on file     Other Topics Concern   . Not on file     Social History Narrative   . No narrative on file       Allergies:     Allergies   Allergen Reactions   . Other      MSG       Medications:     Current Discharge Medication List      CONTINUE these medications which have NOT CHANGED    Details   cefadroxil (DURICEF) 500 MG capsule TAKE ONE CAPSULE TWICE A DAY BY MOUTH STARTING NIGHT OF SURGERY  Refills: 0      FLUoxetine (PROZAC) 20 MG capsule Take 20 mg by mouth daily.      ondansetron (ZOFRAN-ODT) 8 MG disintegrating tablet PLEASE SEE ATTACHED FOR DETAILED DIRECTIONS  Refills: 0      oxyCODONE-acetaminophen (LYNOX) 10-300 MG per tablet Take 1 tablet by mouth every 4 (four) hours as needed for Pain.      phentermine (ADIPEX-P) 37.5 MG tablet Take 37.5 mg by mouth every morning before breakfast.      traZODone (DESYREL) 50 MG tablet Take 50 mg by mouth nightly.      UNABLE TO FIND Med Name: Patient states water pill              Review of Systems:   All other systems were reviewed and are negative except: per HPI    Physical Exam:     VITAL SIGNS: Physical Exam:   Temp:  [98.6 F (37 C)-99.1 F (37.3 C)] 99.1 F (37.3 C)  Heart Rate:  [89-114] 90  Resp Rate:  [16-20] 20  BP: (116-129)/(57-70) 116/57  There is no height or weight on file to calculate BMI.  No intake or output data in the 24 hours ending 04/20/17 2028 GEN: alert and oriented x 3; NAD, appropriate discomfort given recent surgery.   HEENT: EOMI, sclera anicteric, MMM  NECK: no JVD  CARDS: RRR, no murmurs, rubs or gallops  LUNGS: clear to auscultation bilaterally, without wheezing, rhonchi, or rales  ABD: linear surgical incision along lower abdomen, c/d/i, without surrounding erythema, warmth or induration. 2 suprapubic JP drains  in place, draining serosanguinous fluid, no leakage. Bruising noted along abdomen, without clear  hematoma formation. Non-distended, non-rigid abdomen. Appropriate tenderness with guarding on exam, no rebound. Normoactive bowel sounds. Noted "crunchiness" palpated superficially on R abdomen.   EXT: No pitting edema in lower extremity, symmetric, however swelling in legs noted along with bruising. 2+ DP pulses palpated.   NEURO: cranial nerves grossly intact, MAE sensation intact  SKIN: Extensive bruising, with likely hematoma formation over lateral back and inner/outer thighs (where liposuction was performed)             Labs Reviewed:     Results     ** No results found for the last 24 hours. **            Imaging Reviewed:     Xr Chest  Ap Portable    Result Date: 04/20/2017  No acute cardiopulmonary disease. Mickel Duhamel, MD 04/20/2017 4:55 PM    Discussed with Dr. Wandalee Ferdinand, Dr. Anselm Jungling    Signed by: Sherrlyn Hock, DO  [place]: [phone]  [place]: [phone]  Office #: [phone]     [id number], Barbara Cower, MD

## 2017-04-21 LAB — BASIC METABOLIC PANEL
Anion Gap: 8 (ref 5.0–15.0)
BUN: 19 mg/dL (ref 7–19)
CO2: 24 mEq/L (ref 22–29)
Calcium: 8.3 mg/dL — ABNORMAL LOW (ref 8.5–10.5)
Chloride: 103 mEq/L (ref 100–111)
Creatinine: 0.8 mg/dL (ref 0.6–1.0)
Glucose: 102 mg/dL — ABNORMAL HIGH (ref 70–100)
Potassium: 4.5 mEq/L (ref 3.5–5.1)
Sodium: 135 mEq/L — ABNORMAL LOW (ref 136–145)

## 2017-04-21 LAB — CBC
Absolute NRBC: 0 10*3/uL
Hematocrit: 25.4 % — ABNORMAL LOW (ref 37.0–47.0)
Hgb: 8.5 g/dL — ABNORMAL LOW (ref 12.0–16.0)
MCH: 30 pg (ref 28.0–32.0)
MCHC: 33.5 g/dL (ref 32.0–36.0)
MCV: 89.8 fL (ref 80.0–100.0)
MPV: 10.1 fL (ref 9.4–12.3)
Nucleated RBC: 0 /100 WBC (ref 0.0–1.0)
Platelets: 170 10*3/uL (ref 140–400)
RBC: 2.83 10*6/uL — ABNORMAL LOW (ref 4.20–5.40)
RDW: 15 % (ref 12–15)
WBC: 8.51 10*3/uL (ref 3.50–10.80)

## 2017-04-21 LAB — GFR: EGFR: >60

## 2017-04-21 MED ORDER — TRAMADOL HCL 50 MG PO TABS
50.00 mg | ORAL_TABLET | Freq: Four times a day (QID) | ORAL | 0 refills | Status: DC | PRN
Start: 2017-04-21 — End: 2018-11-21

## 2017-04-21 MED ORDER — TRAMADOL HCL 50 MG PO TABS
50.00 mg | ORAL_TABLET | Freq: Four times a day (QID) | ORAL | Status: DC | PRN
Start: 2017-04-21 — End: 2017-04-21
  Administered 2017-04-21: 50 mg via ORAL
  Filled 2017-04-21: qty 1

## 2017-04-21 MED ORDER — DIPHENHYDRAMINE-ZINC ACETATE 2-0.1 % EX CREA
TOPICAL_CREAM | Freq: Three times a day (TID) | CUTANEOUS | Status: DC | PRN
Start: 2017-04-21 — End: 2017-04-21
  Filled 2017-04-21: qty 28.4

## 2017-04-21 MED ORDER — DIPHENHYDRAMINE-ZINC ACETATE 2-0.1 % EX CREA
TOPICAL_CREAM | Freq: Three times a day (TID) | CUTANEOUS | 0 refills | Status: DC | PRN
Start: 2017-04-21 — End: 2018-11-21

## 2017-04-21 MED ORDER — OXYCODONE HCL 5 MG PO TABS
5.00 mg | ORAL_TABLET | Freq: Once | ORAL | Status: AC
Start: 2017-04-21 — End: 2017-04-21
  Administered 2017-04-21: 04:00:00 5 mg via ORAL

## 2017-04-21 NOTE — Plan of Care (Signed)
Problem: Safety  Goal: Patient will be free from injury during hospitalization  Outcome: Progressing   04/21/17 0045   Goal/Interventions addressed this shift   Patient will be free from injury during hospitalization  Assess patient's risk for falls and implement fall prevention plan of care per policy;Provide and maintain safe environment;Use appropriate transfer methods;Ensure appropriate safety devices are available at the bedside;Include patient/ family/ care giver in decisions related to safety;Hourly rounding;Assess for patients risk for elopement and implement Elopement Risk Plan per policy;Provide alternative method of communication if needed (communication boards, writing)   Bed is low and locked , call bell and  patient's belonging are within reach.  Purposeful rounding done

## 2017-04-21 NOTE — Discharge Instr - Activity (Signed)
As tolerated

## 2017-04-21 NOTE — Discharge Instr - AVS First Page (Addendum)
Reason for your Hospital Admission:  Urinary retention, AKI      Instructions for after your discharge:  You were admitted for urinary retention causing an acute kidney injury following surgery. This is a fairly common side effect of anesthesia and pain medication after surgery. Your kidneys recovered quickly after a foley catheter relieved the retention. Your foley was removed before discharge for a void trial and you were able to urinate on your own without a large residual volume in your bladder.

## 2017-04-21 NOTE — Progress Notes (Signed)
Patient seen and discussed with Dr. [place].  Physically present for his exam, pertinent portions directly duplicated and agree with findings as outlined in his note.  61 yo woman with acute urinary retention following recent cosmetic surgery, likely due to opioids.  Creatinine initially elevated, now normal. There was mention of hypoxia at the outside facility but she has had no hypoxia off O2 since admission.  Foley currently in place.  She notes itching from her pain medication and also notes pain at her surgical site.  Agree with voiding trial.  If she cannot void, may need to be discharged home with Foley in place for a few days.  Will switch pain medication given urinary retention and itching.  Would try to avoid oral antihistamines--will try topical benadryl for itching.  Plan to discharge home later today with or without Foley in place depending on results of voiding trial.  Agree with assessment and plan per Dr. [place].    Doyle Askew, MD  [phone] - 24 hour number for patients admitted to [facility]  [phone] - 24 hour number for patients admitted to [facility]  [phone] - Office number

## 2017-04-21 NOTE — Progress Notes (Signed)
[facility] Family Practice Progress Note  Physician Available 24 hours a day:  [place] Resident: [phone]  [place] Resident: [phone]  Office#: [phone]     Date Time: 04/21/17 7:35 AM  Patient Name: Krista Gray  Attending Physician: Doyle Askew, MD  Primary Care Physician: Lanell Persons, MD    CC: Urinary retention    Assessment:   Principal Problem:    Urinary retention  Active Problems:    Depression    AKI (acute kidney injury)  Resolved Problems:    * No resolved hospital problems. *      61 y.o. female PMHx significant for depression, obesity, recent cosmetic abdomnoplasty, liposuction of back, legs, buttocks 04/19/17 admitted with Urinary retention, s/p foley placement in ER, and AKI.     Plan:   #Urinary Retention AKI likely 2/2 obstructive (urinary retention can be opiod induced) versus prerenal - resolved  - foley in place, can attempt void trial today, if fails, can go home with foley and f/u with urology this week  - BMP this AM with Cr back to normal   - Pain control: Tylenol,   -will avoid further oxy given potential for retention, try tramadol  -topical benadryl for itching, avoid PO benadryl for worsening urinary retention     ?Hypoxia  -reported from [facility] that patient was hypoxic and required O2, arrived on 2L O2, however no documented low sats at access, weaned to RA and breathing comfortably here  -possibly atelectasis post-op, encourage incentive spirometer    2) Chronic:   Depression - fluoxetine 40mg  daily, trazodone 75mg  qPM   Hx of shingles - not active now   Post surgical ppx - cont. Cefadroxil (husband will bring from home)    #FEN/GI   -replete lytes, Diet regular    #PPX  - DVT: SCDs, SQH  - GI: Not indicated    DISPO:  Today's date: 04/21/2017  Admit Date: 04/20/2017  8:13 PM  Anticipated medical stability for discharge:Yellow - maybe tomorrow vs today  Service status: Observation:patient is stable and improving.  Anticipated discharge needs:  TBD    Subjective:     Interval History/HPI: patient reports feeling much better as soon as foley catheter placed last night, still with notable post-op pain today and reprots itching associated with pain meds, no shortness of breath or chest pain       Review of Systems:     Denies fever, sweats, chills, chest pain, dyspnea, N/V/D    Physical Exam:     VITAL SIGNS: Physical Exam:   Temp:  [98.2 F (36.8 C)-99.1 F (37.3 C)] 98.4 F (36.9 C)  Heart Rate:  [89-114] 90  Resp Rate:  [16-20] 20  BP: (92-129)/(56-75) 110/56        Intake/Output Summary (Last 24 hours) at 04/21/17 0735  Last data filed at 04/21/17 0400   Gross per 24 hour   Intake             1000 ml   Output             1760 ml   Net             -760 ml    GEN: awake, alert and oriented x 3  HEENT: MMM  CARDS: regular rate and rhythm, no m/r/g  LUNGS: clear to auscultation bilaterally, without wheezing, rhonchi, or rales  ABD: soft, tender diffusely post-op, drains in place from abdominoplasty   EXT: no edema  Current Meds:     Current Facility-Administered Medications   Medication Dose Route Frequency   . FLUoxetine  40 mg Oral Daily   . heparin (porcine)  5,000 Units Subcutaneous Q8H SCH   . ondansetron  4 mg Oral Q8H SCH    Or   . ondansetron  4 mg Intravenous Q8H SCH   . NON-FORMULARY PAT OWN MED order form  500 mg Oral BID   . traZODone  50 mg Oral QHS     Current Facility-Administered Medications   Medication Dose Route Frequency Last Rate     Current Facility-Administered Medications   Medication Dose Route   . acetaminophen  650 mg Oral   . naloxone  0.2 mg Intravenous   . oxyCODONE  5 mg Oral         Labs:     Labs (last 24 hours):  Results     Procedure Component Value Units Date/Time    Basic Metabolic Panel [[id number]]  (Abnormal) Collected:  04/21/17 0521    Specimen:  Blood Updated:  04/21/17 0655     Glucose 102 (H) mg/dL      BUN 19 mg/dL      Creatinine 0.8 mg/dL      Calcium 8.3 (L) mg/dL      Sodium 409 (L) mEq/L       Potassium 4.5 mEq/L      Chloride 103 mEq/L      CO2 24 mEq/L      Anion Gap 8.0    GFR [[id number]] Collected:  04/21/17 0521     Updated:  04/21/17 0655     EGFR >60.0    CBC without differential [[id number]]  (Abnormal) Collected:  04/21/17 0521    Specimen:  Blood from Blood Updated:  04/21/17 0601     WBC 8.51 x10 3/uL      Hgb 8.5 (L) g/dL      Hematocrit 57.8 (L) %      Platelets 170 x10 3/uL      RBC 2.83 (L) x10 6/uL      MCV 89.8 fL      MCH 30.0 pg      MCHC 33.5 g/dL      RDW 15 %      MPV 10.1 fL      Nucleated RBC 0.0 /100 WBC      Absolute NRBC 0.00 x10 3/uL             Imaging (last 24 hours), reviewed and are significant for:  Xr Chest  Ap Portable    Result Date: 04/20/2017  No acute cardiopulmonary disease. Mickel Duhamel, MD 04/20/2017 4:55 PM        Signed by: Bennie Dallas, DO   [facility]: [phone]  [facility]: [phone]  Office#: [phone]     VQ:[id number], Barbara Cower, MD

## 2017-04-21 NOTE — Discharge Summary (Signed)
[facility] Discharge Summary  Physician Available 24 hours a day:  [facility] Resident: [phone]  [place] Resident: [phone]  Office#: [phone]       Date Time: 04/21/17 12:48 PM  Patient Name: Krista Gray  Attending Physician: Doyle Askew, MD  Primary Care Physician: Lanell Persons, MD    Date of Admission: 04/20/2017  Date of Discharge: 04/21/2017    Hospitalization Diagnoses:     Principal Problem:    Urinary retention  Active Problems:    Depression    AKI (acute kidney injury)  Resolved Problems:    * No resolved hospital problems. *        Disposition:     home with family    Pending Results, Recommendations & Instructions after discharge:     1. Micro / Labs / Path pending: n/a   2. Date of completion for antibiotics or other medications: n/a    HPI:   Analee C [place] is a 61 y.o. female PMHx significant for  depression, obesity, recent cosmetic abdomnoplasty and liposuction 04/19/17 admitted with Urinary retention, and shakiness. Patient underwent 5 hour procedure yesterday where she had a "tummy tuck", liposuction to back, inner and outer thighs, and buttocks bilaterally. Patient discharged home that day with percocet, of which she states she has taken about 4-6 tablets of in past 24 hours. +flatus, no BM yet (due to pain). Denies significant nausea, no vomiting. Decreased appetite. Denies fevers, chills. Feels she looks a little pale. Otherwise, ambulating without difficulty. The next day at 2pm (day of admission), she noticed she had not been able to urinate, had bladder distention, so she called her surgeon Dr. Paulina Fusi, who advised her to go to the ER for a catheter placement. In the ER, she was found to have a mild AKI, so was admitted. Currently, she no longer feels bladder distention, still with mild tremor that goes away when distracted.     ED COURSE:   VS: AF, HR 114, now 91, 92% on 2L, HDS   LABS: WBC 10.5, Hg 9.9, Cr 1.5, Na 132, K 5.2, wnl LFTs, normal  UA   EKG: n/a    IMAGING: CXR wnl   MEDS: 4mg  IV morphine, 1L NS bolus    SPECIALISTS:   Dr. Paulina Fusi - Plastic surgeon         Consultations:   Treatment Team: Attending Provider: Doyle Askew, MD; Technician: Deliah Goody; Registered Nurse: Alois Cliche, [facility] Course:     Patient was admitted for AKI and acute urinary retention following abdominoplasty. Renal function recovered quickly after foley catheter relieved retention. Patient was advised to minimize narcotic pain medication and anti-histamines (benadryl) which she was taking due to ithing from narcotics, because of the potential to worsen urinary retention. A void trial was attempted on the morning of discharge and she was successfully able to void 400cc with only 20cc PVR on bladder scan.     Discharge Day Exam:     VITAL SIGNS: PHYSICAL:   Temp:  [98.2 F (36.8 C)-99.1 F (37.3 C)] 98.5 F (36.9 C)  Heart Rate:  [89-114] 98  Resp Rate:  [16-20] 20  BP: (92-129)/(54-75) 111/54  Body mass index is 32.65 kg/m.   GEN: awake, alert and oriented x 3  HEENT: MMM  CARDS: regular rate and rhythm, no m/r/g  LUNGS: clear to auscultation bilaterally, without wheezing, rhonchi, or rales  ABD: soft, tender diffusely post-op, drains in place  from abdominoplasty   EXT: no edema          Recent Labs:         Recent Labs  Lab 04/21/17  0521 04/20/17  1550   WBC 8.51 10.54   Hgb 8.5* 9.9*   Hematocrit 25.4* 30.1*   Platelets 170 211             Recent Labs  Lab 04/20/17  1550   Alkaline Phosphatase 57   Bilirubin, Total 0.7   Protein, Total 5.7*   Albumin 3.4*   ALT 31   AST (SGOT) 33        Recent Labs  Lab 04/21/17  0521 04/20/17  1550   Sodium 135* 132*   Potassium 4.5 5.2*   Chloride 103 99*   CO2 24 22   BUN 19 38.6*   Glucose 102* 132*   Calcium 8.3* 8.7                 Invalid input(s): FREET4         Procedures/Radiology performed:     Xr Chest  Ap Portable    Result Date: 04/20/2017  No acute cardiopulmonary disease. Mickel Duhamel, MD  04/20/2017 4:55 PM        Discharge Instructions & Follow Up Plan for Patient:     Diet:regular    Activity/Weight Bearing:as tolerated    Code Status:full    Instructed to follow up with:   1. Primary Care Doctor Lanell Persons, MD in  2-4 days   2. Dr. Paulina Fusi, plastic surgery as previously planned    Discharge Medications:        Medication List      START taking these medications    diphenhydrAMINE-zinc acetate cream  Commonly known as:  BENADRYL  Apply topically 3 (three) times daily as needed for Itching.     traMADol 50 MG tablet  Commonly known as:  ULTRAM  Take 1 tablet (50 mg total) by mouth every 6 (six) hours as needed for Pain.        CONTINUE taking these medications    cefadroxil 500 MG capsule  Commonly known as:  DURICEF     FLUoxetine 20 MG capsule  Commonly known as:  PROzac     ondansetron 8 MG disintegrating tablet  Commonly known as:  ZOFRAN-ODT     phentermine 37.5 MG tablet  Commonly known as:  ADIPEX-P     traZODone 50 MG tablet  Commonly known as:  DESYREL     UNABLE TO FIND        STOP taking these medications    oxyCODONE-acetaminophen 10-300 MG per tablet  Commonly known as:  LYNOX           Where to Get Your Medications      These medications were sent to [id number] - [place], [place] - [address]  [address] [place] [address]    Phone:  [phone]    diphenhydrAMINE-zinc acetate cream     You can get these medications from any pharmacy    Bring a paper prescription for each of these medications   traMADol 50 MG tablet              Signed by: Bennie Dallas, DO    CC: Lanell Persons, MD

## 2017-04-21 NOTE — Discharge Instr - Diet (Signed)
Regular diet

## 2017-04-21 NOTE — Progress Notes (Signed)
Discharge instructions discussed with patient, pt verbalized understanding. Patient has follow up appt w Plastics on Mon. Patient's PIV and tele removed. Patient did not want script for Tramadol filled here, rather take home. Husband here to transport pt home

## 2017-04-21 NOTE — Plan of Care (Signed)
SR on tele, ST with HR increasing to 130s with ambulation, likely pain related. Pt denies any cp, palpitations or sob. C/o abdominal and incisional pain from abdplasty, exacerbated with movement. Patient given po Tramadol with mild relief. Also c/o itching to back- topical placed- relief per patient. Foley removed in AM- voiding trial successful with minimal residual on bladder scan. MD updated, plan is to discharge patient to home.

## 2017-04-21 NOTE — UM Notes (Signed)
This clinical review is based on/compiled from documentation provided by the treatment team within the patient's medical record.    Raymond Gurney, RN, BSN  Clinical Case Manager  [facility]    [address]    [place], [place] [address]  NPI: [id number]  Tax ID: [id number]  Phone: [phone]  Fax: [phone]    Please use fax number [phone] to provide authorization for hospital services or to request additional information.        PATIENT NAME: Krista Gray,Krista Gray  DOB: [birth year]  PMH:   Diagnosis   . Anxiety   . Depression   . Diabetes mellitus   . Hypertension     ADMITTED ON: 04/20/2017 @ 5486 61 year old female presents with urinary retention and hypoxia after a tummy tuck    ED Triage Vitals [04/20/17 2015]   Enc Vitals Group      BP 92/64      Heart Rate 89      Resp Rate 20      Temp 98.2 F (36.8 Gray)      Temp Source Oral      SpO2 98 %      Weight 91.8 kg (202 lb 4.8 oz)      Height 1.676 m (5\' 6" )      Pain Score 8     ADMISSION DIAGNOSIS:   04/20/17 2039  Place for Observation Services Once    Status:    Question Answer Comment   Admitting Physician Rodney Booze H    Diagnosis Urinary retention    Estimated Length of Stay < 2 midnights    Tentative Discharge Plan? Home or Self Care    Patient Class Observation            04/20/2017  NOTES:   #AKI likely 2/2 obstructive (urinary retention can be opiod induced) versus prerenal  - s/p foley  - 1L LR bolus now  - BMP daily  - Pain control: Tylenol, PO oxycodone   - Would plan for void trial in AM, if pass, can likely [place] home    #Noted "crunchiness" palpated on R abdominal wall - crepitus versus expected post-surgical changes. With appropriate post-surgical changes.   - Since patient appears clinically nontoxic, with normal vitals, will hold on further imaging at this point  - Even if there is some subcutaneous air, more likely expected post-abdomnoplasty changes rather than perforated bowel.   - Can wear abdominal binder if  patient can tolerate.         LABS:      04/20/2017 15:50   WBC 10.54   Hemoglobin 9.9 (L)   Hematocrit 30.1 (L)   Platelet Count 211   RBC 3.31 (L)   MCV 90.9   MCH 29.9   MCHC 32.9   RDW 15   MPV 9.9   Neutrophils 71.6   Lymphocytes Automated 15.4   Monocytes 12.0   Eosinophils Automated 0.3   Basophils Automated 0.2   Immature Granulocyte 0.5   Nucleated RBC 0.0   Neutro # 7.56   Abs Lymph Automated 1.62   Abs Eos Automated 0.03   Abs Mono Automated 1.26 (H)   Absolute Baso Automated 0.02   Absolute Immature Granulocyte 0.05   Absolute NRBC 0.00   Glucose 132 (H)   BUN 38.6 (H)   Creatinine 1.5 (H)   Sodium 132 (L)   Potassium 5.2 (H)   Chloride 99 (L)   Carbon Dioxide,  Whole Blood 22   Calcium 8.7   Anion Gap 11.0   EGFR 35.5   AST Whole Blood 33   ALT, Whole Blood 31   Alkaline Phosphatase 57   Albumin 3.4 (L)   Protein, Total 5.7 (L)   Globulin 2.3   Albumin/Globulin Ratio 1.5   Bilirubin, Total 0.7       MEDS:     Medication   . enoxaparin (LOVENOX) syringe 40 mg   . PATIENT SUPPLIED NON FORMULARY 500 mg   . oxyCODONE (ROXICODONE) immediate release tablet 5 mg   . oxyCODONE-acetaminophen (LYNOX) 10-300 MG per tablet   . diphenhydrAMINE-zinc acetate (BENADRYL) 2-0.1 % cream   . traMADol (ULTRAM) tablet 50 mg   . PATIENT SUPPLIED NON FORMULARY 500 mg   . ondansetron (ZOFRAN) injection 4 mg   . ondansetron (ZOFRAN) tablet 4 mg   . FLUoxetine (PROzac) capsule 40 mg   . traZODone (DESYREL) tablet 50 mg   . heparin (porcine) injection 5,000 Units   . acetaminophen (TYLENOL) tablet 650 mg   . naloxone [facility]) injection 0.2 mg     04/21/17- [place] home    Vitals:    04/20/17 2328 04/21/17 0431 04/21/17 0800 04/21/17 1152   BP: 105/75 110/56 113/63 111/54   Pulse: 91 90 (!) 107 98   Resp: 20 20 18 20    Temp: 98.6 F (37 Gray) 98.4 F (36.9 Gray) 98.8 F (37.1 Gray) 98.5 F (36.9 Gray)   TempSrc: Oral Oral Oral Oral   SpO2: 92% 90% 96% 92%   Weight:       Height:

## 2017-11-02 DIAGNOSIS — R7303 Prediabetes: Secondary | ICD-10-CM | POA: Insufficient documentation

## 2017-11-02 DIAGNOSIS — E559 Vitamin D deficiency, unspecified: Secondary | ICD-10-CM | POA: Insufficient documentation

## 2018-04-25 ENCOUNTER — Other Ambulatory Visit (INDEPENDENT_AMBULATORY_CARE_PROVIDER_SITE_OTHER): Payer: Self-pay | Admitting: Family Medicine

## 2018-11-19 ENCOUNTER — Encounter (INDEPENDENT_AMBULATORY_CARE_PROVIDER_SITE_OTHER): Payer: Self-pay | Admitting: Family Medicine

## 2018-11-21 ENCOUNTER — Encounter (INDEPENDENT_AMBULATORY_CARE_PROVIDER_SITE_OTHER): Payer: Self-pay | Admitting: Family Medicine

## 2018-11-21 DIAGNOSIS — F419 Anxiety disorder, unspecified: Secondary | ICD-10-CM

## 2018-11-21 MED ORDER — BUPROPION HCL ER (XL) 150 MG PO TB24
150.00 mg | ORAL_TABLET | Freq: Every day | ORAL | 0 refills | Status: DC
Start: 2018-11-21 — End: 2019-02-20

## 2018-11-21 NOTE — Progress Notes (Signed)
Pended order and sent to dr Clearnce Sorrel with patient

## 2018-12-11 LAB — PAP WITH HR HPV
.: 0
HPV, high-risk: NEGATIVE

## 2019-01-07 ENCOUNTER — Other Ambulatory Visit (INDEPENDENT_AMBULATORY_CARE_PROVIDER_SITE_OTHER): Payer: Self-pay | Admitting: Family Medicine

## 2019-01-07 DIAGNOSIS — F411 Generalized anxiety disorder: Secondary | ICD-10-CM

## 2019-01-07 NOTE — Telephone Encounter (Signed)
Left message to schedule a visit.  

## 2019-01-07 NOTE — Telephone Encounter (Signed)
Not able to schedule due to [place] trip

## 2019-01-14 ENCOUNTER — Other Ambulatory Visit (INDEPENDENT_AMBULATORY_CARE_PROVIDER_SITE_OTHER): Payer: Self-pay | Admitting: Family Medicine

## 2019-01-14 DIAGNOSIS — F411 Generalized anxiety disorder: Secondary | ICD-10-CM

## 2019-01-14 MED ORDER — SERTRALINE HCL 100 MG PO TABS
100.00 mg | ORAL_TABLET | Freq: Every day | ORAL | 1 refills | Status: DC
Start: 2019-01-14 — End: 2019-02-20

## 2019-01-14 MED ORDER — SERTRALINE HCL 50 MG PO TABS
50.00 mg | ORAL_TABLET | Freq: Every day | ORAL | 1 refills | Status: DC
Start: 2019-01-14 — End: 2019-02-20

## 2019-01-14 NOTE — Telephone Encounter (Signed)
Pt is requesting to come in for a medication check as well as the flu shot.  she is in town the week of October 12th and hoping Dr. Excell Seltzer can see her  for a few minutes some time that week. She  also needs to get a 30-day renewal of Sertraline 100mg  and Sertraline 50mg  prior to appointment because she has less than 30 days left on these. pharmacy is the [facility] that [facility] have on [facility].  Please advise for work in the week of 10/12 since pt will not be in area for 14 days prior to appt as office policy

## 2019-01-14 NOTE — Telephone Encounter (Signed)
Takes 50 mg and 100 mg together , we sent only 100 mg the other day, she has an appointment on 02/20/2019

## 2019-01-15 NOTE — Telephone Encounter (Signed)
Left message to schedule a VV

## 2019-02-20 ENCOUNTER — Encounter (INDEPENDENT_AMBULATORY_CARE_PROVIDER_SITE_OTHER): Payer: Self-pay | Admitting: Family Medicine

## 2019-02-20 ENCOUNTER — Telehealth (INDEPENDENT_AMBULATORY_CARE_PROVIDER_SITE_OTHER): Payer: Commercial Managed Care - POS | Admitting: Family Medicine

## 2019-02-20 VITALS — Temp 97.0°F | Ht 67.0 in | Wt 179.0 lb

## 2019-02-20 DIAGNOSIS — G4700 Insomnia, unspecified: Secondary | ICD-10-CM

## 2019-02-20 DIAGNOSIS — F331 Major depressive disorder, recurrent, moderate: Secondary | ICD-10-CM

## 2019-02-20 DIAGNOSIS — F411 Generalized anxiety disorder: Secondary | ICD-10-CM

## 2019-02-20 DIAGNOSIS — M5441 Lumbago with sciatica, right side: Secondary | ICD-10-CM

## 2019-02-20 MED ORDER — BUPROPION HCL ER (XL) 150 MG PO TB24
150.00 mg | ORAL_TABLET | Freq: Every day | ORAL | 1 refills | Status: AC
Start: 2019-02-20 — End: ?

## 2019-02-20 MED ORDER — SERTRALINE HCL 50 MG PO TABS
50.00 mg | ORAL_TABLET | Freq: Every day | ORAL | 1 refills | Status: AC
Start: 2019-02-20 — End: ?

## 2019-02-20 MED ORDER — SERTRALINE HCL 100 MG PO TABS
100.00 mg | ORAL_TABLET | Freq: Every day | ORAL | 1 refills | Status: AC
Start: 2019-02-20 — End: ?

## 2019-02-20 MED ORDER — NABUMETONE 750 MG PO TABS
750.00 mg | ORAL_TABLET | Freq: Two times a day (BID) | ORAL | 1 refills | Status: DC | PRN
Start: 2019-02-20 — End: 2019-07-15

## 2019-02-20 NOTE — Progress Notes (Signed)
[facility]                       Date of Virtual Visit: 02/20/2019 10:24 AM        Patient ID: Krista Gray [place] is a 63 y.o. female.  Attending Physician: Delfin Gant, MD       Telemedicine Eligibility:    State Location:  [x]  [place]  []  [place]  []  [place] []  [place]  []  Other:    Physical Location:  [x]  Home  []         []        []          []  Other:    Patient Identity Verification:  [x]  State Issued ID  []  Insurance Eligibility Check  []  Other:    Physical Address Verification: (for 911)  [x]  Yes  []  No    Personal identity shared with patient:  [x]  Yes  []  No    Education on nature of video visit shared with patient:  [x]  Yes  []  No    Emergency plan agreed upon with patient:  [x]  Yes  []  No    If the patient had not had this virtual visit, what would they have done?  []         []         []        []          []  Other:    Visit terminated since not appropriate for virtual care:  [x]  N/A  []  Reason:         Chief Complaint:    Chief Complaint   Patient presents with    Anxiety    Back Pain               HPI:    LBP with radiation into R anterolateral thigh for the past 6 weeks; 2 weeks ago tripped on boat and fell forward, causing worse back pain.  Tried Meloxicam and Ibuprofen w/o much improvement. Worse when standing, but improves after moving around. No numbness in that leg or weakness. No unusual incontinence.     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  Forgot to take all of her meds on this trip back to [place] from [place] where she is moving soon.  Has been feeling more emotional as result.  Anxiety has been under good control with medication when she is taking them as prescribed.  Has not really needed lorazepam recently.  Insomnia has been controlled with trazodone at night.  She has been on this for some time.    Anxiety  Presents for follow-up visit.       Back Pain  This is a new problem. The current episode started more than 1  month ago. The problem occurs constantly. The problem is unchanged. The pain is present in the lumbar spine. The pain is at a severity of 7/10. The pain is severe. The symptoms are aggravated by standing. Associated symptoms include leg pain and tingling. She has tried NSAIDs for the symptoms. The treatment provided mild relief.             Problem List:    Patient Active Problem List   Diagnosis    Urinary retention    Depression    Obese    AKI (acute kidney injury)    Abnormal weight gain    Allergic rhinitis    Anxiety state  Dizziness and giddiness    Encounter for lipid screening for cardiovascular disease    Insomnia    Menopausal symptom    Prediabetes    Recurrent major depressive episodes, moderate    Vitamin D deficiency             Current Meds:    No outpatient medications have been marked as taking for the 02/20/19 encounter (Telemedicine Visit) with Delfin Gant, MD.          Allergies:    Allergies   Allergen Reactions    Dust Mite Extract     Other      MSG    Pollen Extract              Past Surgical History:    Past Surgical History:   Procedure Laterality Date    ABDOMINAL SURGERY      COSMETIC SURGERY             Family History:    Family History   Problem Relation Age of Onset    Diabetes Mother     Hypertension Mother     Osteoporosis Mother            Social History:    Social History     Tobacco Use    Smoking status: Former Smoker    Smokeless tobacco: Never Used   Substance Use Topics    Alcohol use: No     Alcohol/week: 0.0 standard drinks    Drug use: No          The following sections were reviewed this encounter by the provider:   Tobacco   Allergies   Meds   Problems   Med Hx   Surg Hx   Fam Hx              Vital Signs:    Temp 97 F (36.1 Gray)    Ht 1.702 m (5\' 7" )    Wt 81.2 kg (179 lb)    BMI 28.04 kg/m          ROS:    Review of Systems   Musculoskeletal: Positive for back pain.   Neurological: Positive for tingling.              Physical Exam:     Physical Exam   GENERAL APPEARANCE: alert, in no acute distress, pleasant, well nourished.   HEAD: normal appearance  EYES: no discharge  EARS: normal hearing  NECK/THYROID: appearance -supple  CHEST: no evidence of respiratory distress. Breathing comfortably  PSYCH: appropriate affect, appropriate mood, normal speech, normal attention        Assessment:    1. Insomnia, unspecified type    2. Generalized anxiety disorder  - sertraline (ZOLOFT) 50 MG tablet; Take 1 tablet (50 mg total) by mouth daily Along with 100 mg QD  Dispense: 90 tablet; Refill: 1  - sertraline (ZOLOFT) 100 MG tablet; Take 1 tablet (100 mg total) by mouth daily Along with 50 mg QD  Dispense: 90 tablet; Refill: 1    3. Recurrent major depressive episodes, moderate  - buPROPion XL (WELLBUTRIN XL) 150 MG 24 hr tablet; Take 1 tablet (150 mg total) by mouth daily  Dispense: 90 tablet; Refill: 1    4. Acute right-sided low back pain with right-sided sciatica  - nabumetone (RELAFEN) 750 MG tablet; Take 1 tablet (750 mg total) by mouth 2 (two) times daily as needed for Pain  Dispense: 40 tablet; Refill: 1  Plan:    Trial of nabumetone.  Take with food.  May use ice or heat also as needed.  Recommend gentle stretching.  Will likely need physical therapy when she gets back to [place].  Continue the current regimen of medicines for depression and anxiety.  Follow-up with another physician down in [place] in about 6 months, sooner if needed.            Follow-up:    No follow-ups on file.         Delfin Gant, MD

## 2019-05-06 ENCOUNTER — Other Ambulatory Visit (INDEPENDENT_AMBULATORY_CARE_PROVIDER_SITE_OTHER): Payer: Self-pay | Admitting: Family Medicine

## 2019-05-06 DIAGNOSIS — G4700 Insomnia, unspecified: Secondary | ICD-10-CM

## 2019-05-06 NOTE — Telephone Encounter (Signed)
Last visit: 02/20/2019.

## 2019-06-11 ENCOUNTER — Encounter (INDEPENDENT_AMBULATORY_CARE_PROVIDER_SITE_OTHER): Payer: Self-pay

## 2019-07-15 ENCOUNTER — Emergency Department: Payer: Commercial Managed Care - POS

## 2019-07-15 ENCOUNTER — Observation Stay
Admission: EM | Admit: 2019-07-15 | Discharge: 2019-07-16 | Disposition: A | Discharge status: Home / Self Care | Payer: Commercial Managed Care - POS | Attending: Surgery | Admitting: Surgery

## 2019-07-15 DIAGNOSIS — Z20822 Contact with and (suspected) exposure to covid-19: Secondary | ICD-10-CM | POA: Insufficient documentation

## 2019-07-15 DIAGNOSIS — N390 Urinary tract infection, site not specified: Secondary | ICD-10-CM

## 2019-07-15 DIAGNOSIS — K810 Acute cholecystitis: Secondary | ICD-10-CM

## 2019-07-15 DIAGNOSIS — K8050 Calculus of bile duct without cholangitis or cholecystitis without obstruction: Secondary | ICD-10-CM | POA: Diagnosis present

## 2019-07-15 DIAGNOSIS — K8010 Calculus of gallbladder with chronic cholecystitis without obstruction: Principal | ICD-10-CM | POA: Insufficient documentation

## 2019-07-15 DIAGNOSIS — E119 Type 2 diabetes mellitus without complications: Secondary | ICD-10-CM | POA: Insufficient documentation

## 2019-07-15 DIAGNOSIS — I10 Essential (primary) hypertension: Secondary | ICD-10-CM | POA: Insufficient documentation

## 2019-07-15 LAB — CBC AND DIFFERENTIAL
Absolute NRBC: 0 10*3/uL (ref 0.00–0.00)
Basophils Absolute Automated: 0.04 10*3/uL (ref 0.00–0.08)
Basophils Automated: 0.8 %
Eosinophils Absolute Automated: 0.13 10*3/uL (ref 0.00–0.44)
Eosinophils Automated: 2.7 %
Hematocrit: 47 % — ABNORMAL HIGH (ref 34.7–43.7)
Hgb: 15.1 g/dL — ABNORMAL HIGH (ref 11.4–14.8)
Immature Granulocytes Absolute: 0.04 10*3/uL (ref 0.00–0.07)
Immature Granulocytes: 0.8 %
Lymphocytes Absolute Automated: 1.26 10*3/uL (ref 0.42–3.22)
Lymphocytes Automated: 26.2 %
MCH: 28.7 pg (ref 25.1–33.5)
MCHC: 32.1 g/dL (ref 31.5–35.8)
MCV: 89.4 fL (ref 78.0–96.0)
MPV: 9.1 fL (ref 8.9–12.5)
Monocytes Absolute Automated: 0.66 10*3/uL (ref 0.21–0.85)
Monocytes: 13.7 %
Neutrophils Absolute: 2.68 10*3/uL (ref 1.10–6.33)
Neutrophils: 55.8 %
Nucleated RBC: 0 /100 WBC (ref 0.0–0.0)
Platelets: 234 10*3/uL (ref 142–346)
RBC: 5.26 10*6/uL — ABNORMAL HIGH (ref 3.90–5.10)
RDW: 15 % (ref 11–15)
WBC: 4.81 10*3/uL (ref 3.10–9.50)

## 2019-07-15 LAB — BASIC METABOLIC PANEL
Anion Gap: 11 (ref 5.0–15.0)
BUN: 19 mg/dL (ref 7–19)
CO2: 25 mEq/L (ref 22–29)
Calcium: 9.8 mg/dL (ref 8.5–10.5)
Chloride: 104 mEq/L (ref 100–111)
Creatinine: 0.9 mg/dL (ref 0.6–1.0)
Glucose: 135 mg/dL — ABNORMAL HIGH (ref 70–100)
Potassium: 3.9 mEq/L (ref 3.5–5.1)
Sodium: 140 mEq/L (ref 136–145)

## 2019-07-15 LAB — URINALYSIS, REFLEX TO MICROSCOPIC EXAM IF INDICATED
Bilirubin, UA: NEGATIVE
Blood, UA: NEGATIVE
Glucose, UA: NEGATIVE
Nitrite, UA: NEGATIVE
Protein, UR: NEGATIVE
Specific Gravity UA: 1.02 (ref 1.001–1.035)
Urine pH: 6 (ref 5.0–8.0)
Urobilinogen, UA: NORMAL mg/dL (ref 0.2–2.0)

## 2019-07-15 LAB — IHS D-DIMER: D-Dimer: 0.47 ug/mL FEU (ref 0.00–0.70)

## 2019-07-15 LAB — GFR: EGFR: >60

## 2019-07-15 LAB — HEPATIC FUNCTION PANEL
ALT: 74 U/L — ABNORMAL HIGH (ref 0–55)
AST (SGOT): 78 U/L — ABNORMAL HIGH (ref 5–34)
Albumin/Globulin Ratio: 1.6 (ref 0.9–2.2)
Albumin: 4.5 g/dL (ref 3.5–5.0)
Alkaline Phosphatase: 103 U/L (ref 37–106)
Bilirubin Direct: 0.5 mg/dL (ref 0.0–0.5)
Bilirubin Indirect: 0.3 mg/dL (ref 0.2–1.0)
Bilirubin, Total: 0.8 mg/dL (ref 0.2–1.2)
Globulin: 2.8 g/dL (ref 2.0–3.6)
Protein, Total: 7.3 g/dL (ref 6.0–8.3)

## 2019-07-15 LAB — COVID-19 (SARS-COV-2): SARS CoV 2 Overall Result: NEGATIVE

## 2019-07-15 LAB — TROPONIN I
Troponin I: <0.01 ng/mL (ref 0.00–0.05)
Troponin I: <0.01 ng/mL (ref 0.00–0.05)

## 2019-07-15 LAB — LIPASE: Lipase: 68 U/L (ref 8–78)

## 2019-07-15 MED ORDER — DIPHENHYDRAMINE HCL 50 MG/ML IJ SOLN
25.00 mg | INTRAMUSCULAR | Status: DC | PRN
Start: 2019-07-15 — End: 2019-07-16

## 2019-07-15 MED ORDER — ACETAMINOPHEN 325 MG PO TABS
650.0000 mg | ORAL_TABLET | ORAL | Status: DC | PRN
Start: 2019-07-15 — End: 2019-07-16
  Administered 2019-07-16: 650 mg via ORAL
  Filled 2019-07-15: qty 2

## 2019-07-15 MED ORDER — ONDANSETRON HCL 4 MG/2ML IJ SOLN
4.00 mg | Freq: Three times a day (TID) | INTRAMUSCULAR | Status: DC | PRN
Start: 2019-07-15 — End: 2019-07-16

## 2019-07-15 MED ORDER — SERTRALINE HCL 50 MG PO TABS
150.0000 mg | ORAL_TABLET | Freq: Every day | ORAL | Status: DC
Start: 2019-07-15 — End: 2019-07-16
  Administered 2019-07-15: 22:00:00 150 mg via ORAL
  Filled 2019-07-15: qty 3

## 2019-07-15 MED ORDER — PIPERACILLIN SOD-TAZOBACTAM SO 4.5 (4-0.5) G IV SOLR
4.50 g | Freq: Three times a day (TID) | INTRAVENOUS | Status: DC
Start: 2019-07-16 — End: 2019-07-16
  Administered 2019-07-16 (×2): 4.5 g via INTRAVENOUS
  Filled 2019-07-15 (×2): qty 20

## 2019-07-15 MED ORDER — PROMETHAZINE HCL 25 MG/ML IJ SOLN
12.50 mg | Freq: Four times a day (QID) | INTRAMUSCULAR | Status: DC | PRN
Start: 2019-07-15 — End: 2019-07-16

## 2019-07-15 MED ORDER — SODIUM CHLORIDE 0.9 % IV MBP
4.50 g | Freq: Once | INTRAVENOUS | Status: AC
Start: 2019-07-15 — End: 2019-07-15
  Administered 2019-07-15: 19:00:00 4.5 g via INTRAVENOUS
  Filled 2019-07-15: qty 20

## 2019-07-15 MED ORDER — PRIMIDONE 50 MG PO TABS
50.0000 mg | ORAL_TABLET | Freq: Every evening | ORAL | Status: DC
Start: 2019-07-15 — End: 2019-07-16
  Administered 2019-07-15: 22:00:00 50 mg via ORAL
  Filled 2019-07-15 (×2): qty 1

## 2019-07-15 MED ORDER — NALOXONE HCL 0.4 MG/ML IJ SOLN (WRAP)
0.20 mg | INTRAMUSCULAR | Status: DC | PRN
Start: 2019-07-15 — End: 2019-07-16

## 2019-07-15 MED ORDER — SODIUM CHLORIDE 0.9 % IV BOLUS
1000.00 mL | Freq: Once | INTRAVENOUS | Status: AC
Start: 2019-07-15 — End: 2019-07-15
  Administered 2019-07-15: 16:00:00 1000 mL via INTRAVENOUS

## 2019-07-15 MED ORDER — ONDANSETRON HCL 4 MG PO TABS
4.0000 mg | ORAL_TABLET | Freq: Three times a day (TID) | ORAL | Status: DC | PRN
Start: 2019-07-15 — End: 2019-07-16

## 2019-07-15 MED ORDER — MORPHINE SULFATE 2 MG/ML IJ/IV SOLN (WRAP)
2.0000 mg | Status: AC | PRN
Start: 2019-07-15 — End: 2019-07-16
  Administered 2019-07-15 – 2019-07-16 (×2): 2 mg via INTRAVENOUS
  Filled 2019-07-15 (×3): qty 1

## 2019-07-15 MED ORDER — PROMETHAZINE HCL 25 MG PO TABS
25.0000 mg | ORAL_TABLET | Freq: Four times a day (QID) | ORAL | Status: DC | PRN
Start: 2019-07-15 — End: 2019-07-16

## 2019-07-15 MED ORDER — MORPHINE SULFATE 4 MG/ML IJ/IV SOLN (WRAP)
4.0000 mg | Freq: Once | Status: AC
Start: 2019-07-15 — End: 2019-07-15
  Administered 2019-07-15: 19:00:00 4 mg via INTRAVENOUS
  Filled 2019-07-15: qty 1

## 2019-07-15 MED ORDER — ACETAMINOPHEN 650 MG RE SUPP
650.00 mg | RECTAL | Status: DC | PRN
Start: 2019-07-15 — End: 2019-07-16

## 2019-07-15 MED ORDER — SODIUM CHLORIDE 0.9 % IV SOLN
INTRAVENOUS | Status: DC
Start: 2019-07-15 — End: 2019-07-16

## 2019-07-15 MED ORDER — TRAZODONE HCL 50 MG PO TABS
50.0000 mg | ORAL_TABLET | Freq: Every evening | ORAL | Status: DC | PRN
Start: 2019-07-15 — End: 2019-07-15

## 2019-07-15 MED ORDER — ONDANSETRON HCL 4 MG/2ML IJ SOLN
4.00 mg | Freq: Four times a day (QID) | INTRAMUSCULAR | Status: AC | PRN
Start: 2019-07-15 — End: 2019-07-16
  Filled 2019-07-15: qty 2

## 2019-07-15 MED ORDER — MORPHINE SULFATE 4 MG/ML IJ/IV SOLN (WRAP)
4.0000 mg | Freq: Once | Status: AC
Start: 2019-07-15 — End: 2019-07-15
  Administered 2019-07-15: 16:00:00 4 mg via INTRAVENOUS
  Filled 2019-07-15: qty 1

## 2019-07-15 MED ORDER — ONDANSETRON HCL 4 MG/2ML IJ SOLN
4.00 mg | Freq: Once | INTRAMUSCULAR | Status: AC
Start: 2019-07-15 — End: 2019-07-15
  Administered 2019-07-15: 16:00:00 4 mg via INTRAVENOUS
  Filled 2019-07-15: qty 2

## 2019-07-15 MED ORDER — HYDROMORPHONE HCL 0.5 MG/0.5 ML IJ SOLN
0.20 mg | INTRAMUSCULAR | Status: DC | PRN
Start: 2019-07-15 — End: 2019-07-16
  Administered 2019-07-16: 0.2 mg via INTRAVENOUS
  Filled 2019-07-15: qty 0.5

## 2019-07-15 MED ORDER — PROMETHAZINE HCL 25 MG RE SUPP
12.50 mg | Freq: Four times a day (QID) | RECTAL | Status: DC | PRN
Start: 2019-07-15 — End: 2019-07-16

## 2019-07-15 MED ORDER — BUPROPION HCL ER (XL) 150 MG PO TB24
150.00 mg | ORAL_TABLET | Freq: Every day | ORAL | Status: DC
Start: 2019-07-15 — End: 2019-07-16
  Administered 2019-07-15: 22:00:00 150 mg via ORAL
  Filled 2019-07-15: qty 1

## 2019-07-15 MED ORDER — LACTATED RINGERS IV SOLN
INTRAVENOUS | Status: DC
Start: 2019-07-15 — End: 2019-07-16

## 2019-07-15 NOTE — Consults (Addendum)
Patient Identification  Krista Gray is a 64 y.o. female.  DOB:  [birth year]  Admit Date:  07/15/2019  Attending Provider:  Daryl Eastern, MD                                  Primary Care Physician:  Patsy Lager, MD   Admitting Diagnosis: Biliary colic [K80.50]  Acute lower urinary tract infection [N39.0]           Lists the present on admission hospital problems  Present on Admission:   Biliary colic        Reason for Consultation:medical management    History of present illness:  Krista Gray is a 64 y.o. female with history of depression scoliosis came to the emergency room because of abdominal pain..  Patient used to live in [place] she moved to [place] she came here on the weekend visiting her friend today in the morning while seated  for her pedicure she developed abdominal pain.  Pain is in epigastric region.  Had nausea no vomiting.  No fever or chills.  Initially she thought it was a gas pain.  Pain did not improve for 2 hours therefore she came to the emergency room.  Patient denies any fever chills.  Patient has a chronic back pain from a scoliosis and she [place] for she she got facet injection 2 months ago.  Patient drinks occasionally no smoking  .  In the emergency room patient was found to have a gallstone on ultrasound.  Patient does not have diabetes.  She has elevated blood pressure but she was never placed on any blood pressure medicine.  She is trying to lose weight taking her blood pressure looks better controlled with weight loss  Past Medical History:  Past Medical History:   Diagnosis Date    Anxiety     Depression     Diabetes mellitus     Hypertension        Past Surgical History:  Past Surgical History:   Procedure Laterality Date    ABDOMINAL SURGERY      COSMETIC SURGERY         Medications:  Medications Prior to Admission   Medication Sig    buPROPion XL (WELLBUTRIN XL) 150 MG 24 hr tablet Take 1 tablet (150 mg total) by mouth daily    Cholecalciferol (Vitamin D3)  2000 UNIT capsule Take 4,000 Units by mouth daily       primidone (MYSOLINE) 50 MG tablet Take 50 mg by mouth nightly    sertraline (ZOLOFT) 100 MG tablet Take 1 tablet (100 mg total) by mouth daily Along with 50 mg QD    sertraline (ZOLOFT) 50 MG tablet Take 1 tablet (50 mg total) by mouth daily Along with 100 mg QD       Social/Family History:  Family History   Problem Relation Age of Onset    Diabetes Mother     Hypertension Mother     Osteoporosis Mother        Social History     Socioeconomic History    Marital status: Married     Spouse name: Not on file    Number of children: Not on file    Years of education: Not on file    Highest education level: Not on file   Occupational History    Not on file   Social Needs    Financial resource  strain: Not on file    Food insecurity     Worry: Not on file     Inability: Not on file    Transportation needs     Medical: Not on file     Non-medical: Not on file   Tobacco Use    Smoking status: Former Smoker    Smokeless tobacco: Never Used   Substance and Sexual Activity    Alcohol use: Yes     Alcohol/week: 3.0 standard drinks     Types: 3 Glasses of wine per week    Drug use: No    Sexual activity: Not on file   Lifestyle    Physical activity     Days per week: Not on file     Minutes per session: Not on file    Stress: Not on file   Relationships    Social connections     Talks on phone: Not on file     Gets together: Not on file     Attends religious service: Not on file     Active member of club or organization: Not on file     Attends meetings of clubs or organizations: Not on file     Relationship status: Not on file    Intimate partner violence     Fear of current or ex partner: Not on file     Emotionally abused: Not on file     Physically abused: Not on file     Forced sexual activity: Not on file   Other Topics Concern    Not on file   Social History Narrative    Not on file       Allergies:  Allergies   Allergen Reactions    Dust Mite  Extract     Other      MSG    Pollen Extract        Review of Systems  A comprehensive review of systems was: History obtained from chart review and the patient  General ROS: negative for  - chills, fatigue, fever and malaise  Psychological ROS: negative for - anxiety, depression, disorientation   Hematological and Lymphatic ROS: negative for - bruising, fatigue and jaundice  Respiratory ROS: no cough, shortness of breath, or wheezing  Cardiovascular ROS: no chest pain or SOB,  dyspnea on exertion,   Genito-Urinary ROS: no dysuria, frequency urgency or burning micturation  Musculoskeletal/Dermatologica ROS: no joint pain ,no rash easy bruising or bleeding  Neurological ROS: no headache or blurry vision, lightheadedness or dizziness    Objective:     Vitals:    07/15/19 2017   BP: 154/78   Pulse: 80   Resp: 16   Temp: 98.4 F (36.9 C)   SpO2: 95%       Intake and Output Summary (Last 24 hours) at Date Time  No intake or output data in the 24 hours ending 07/15/19 2058    Physical Exam:  Mental status - alert, oriented to person, place, and time  HEENT: PERRLA; EOMI; Conjunctiva is clear.   Neck - supple, no significant adenopathy  Chest - clear to auscultation, no wheezes, rales or rhonchi, symmetric air entry  Heart - normal rate, regular rhythm, normal S1, S2, no murmurs, rubs, clicks or gallops  Abdomen - soft, nontender, nondistended, no masses or organomegaly  GU - no lesions or discharge, no masses or tenderness, no hernias  Neurological - alert, oriented, normal speech, no focal motor sensory deficit present  Musculoskeletal - no joint tenderness, deformity or swelling  Extremities - peripheral pulses normal, no pedal edema, no clubbing or cyanosis  Skin - normal coloration and turgor, no rashes,    Labs:     Recent Labs   Lab 07/15/19  1606   RBC 5.26*   Hgb 15.1*   Hematocrit 47.0*   MCV 89.4   MCHC 32.1   RDW 15   MPV 9.1   Platelets 234         Recent Labs   Lab 07/15/19  1606   Glucose 135*   BUN 19    Creatinine 0.9   Calcium 9.8   Sodium 140   Potassium 3.9   Chloride 104   CO2 25         Results     Procedure Component Value Units Date/Time    COVID-19 (SARS-COV-2) ([place] [facility])- for patients at [facility] and [facility] [[id number]] Collected: 07/15/19 1922    Specimen: Nasopharyngeal Swab from Nasopharynx Updated: 07/15/19 1948     Purpose of COVID testing Screening     SARS-CoV-2 Specimen Source Nasopharyngeal     SARS CoV 2 Overall Result Negative    Narrative:      o Collect and clearly label specimen type:  o Upper respiratory specimen: One Nasopharyngeal Dry Swab NO  Transport Media.  o Hand deliver to laboratory ASAP  Indication for testing->Extended care facility admission to  semi private room    Troponin I [[id number]] Collected: 07/15/19 1913    Specimen: Blood Updated: 07/15/19 1939     Troponin I <0.01 ng/mL     Troponin I [[id number]] Collected: 07/15/19 1606    Specimen: Blood Updated: 07/15/19 1645     Troponin I <0.01 ng/mL     Hepatic function panel (LFT) [[id number]]  (Abnormal) Collected: 07/15/19 1606    Specimen: Blood Updated: 07/15/19 1639     Bilirubin, Total 0.8 mg/dL      Bilirubin Direct 0.5 mg/dL      Bilirubin Indirect 0.3 mg/dL      AST (SGOT) 78 U/L      ALT 74 U/L      Alkaline Phosphatase 103 U/L      Protein, Total 7.3 g/dL      Albumin 4.5 g/dL      Globulin 2.8 g/dL      Albumin/Globulin Ratio 1.6    Lipase [[id number]] Collected: 07/15/19 1606    Specimen: Blood Updated: 07/15/19 1639     Lipase 68 U/L     GFR [[id number]] Collected: 07/15/19 1606     Updated: 07/15/19 1639     EGFR >60.0    Basic Metabolic Panel [[id number]]  (Abnormal) Collected: 07/15/19 1606    Specimen: Blood Updated: 07/15/19 1639     Glucose 135 mg/dL      BUN 19 mg/dL      Creatinine 0.9 mg/dL      Calcium 9.8 mg/dL      Sodium 329 mEq/L      Potassium 3.9 mEq/L      Chloride 104 mEq/L      CO2 25 mEq/L      Anion Gap 11.0    Urine Culture [[id number]] Collected: 07/15/19 1606    Specimen: Urine, Clean Catch Updated:  07/15/19 1636    Narrative:      Replace urinary catheter prior to obtaining the urine culture  if it has been in place for greater than or equal to 14  days:->N/A No Foley  Indications  for Urine Culture:->Suprapubic Pain/Tenderness or  Dysuria    D-Dimer [[id number]] Collected: 07/15/19 1606     Updated: 07/15/19 1624     D-Dimer 0.47 ug/mL FEU     UA with reflex to micro (pts  3 + yrs) [[id number]]  (Abnormal) Collected: 07/15/19 1606    Specimen: Urine Updated: 07/15/19 1621     Urine Type Clean Catch     Color, UA Yellow     Clarity, UA Hazy     Specific Gravity UA 1.020     Urine pH 6.0     Leukocyte Esterase, UA Moderate     Nitrite, UA Negative     Protein, UR Negative     Glucose, UA Negative     Ketones UA Trace     Urobilinogen, UA Normal mg/dL      Bilirubin, UA Negative     Blood, UA Negative     RBC, UA 3 - 5 /hpf      WBC, UA 26 - 50 /hpf      Squamous Epithelial Cells, Urine 0 - 5 /hpf      Trans Epithel, UA 0 - 2 /hpf     CBC and differential [[id number]]  (Abnormal) Collected: 07/15/19 1606    Specimen: Blood Updated: 07/15/19 1613     WBC 4.81 x10 3/uL      Hgb 15.1 g/dL      Hematocrit 08.6 %      Platelets 234 x10 3/uL      RBC 5.26 x10 6/uL      MCV 89.4 fL      MCH 28.7 pg      MCHC 32.1 g/dL      RDW 15 %      MPV 9.1 fL      Neutrophils 55.8 %      Lymphocytes Automated 26.2 %      Monocytes 13.7 %      Eosinophils Automated 2.7 %      Basophils Automated 0.8 %      Immature Granulocytes 0.8 %      Nucleated RBC 0.0 /100 WBC      Neutrophils Absolute 2.68 x10 3/uL      Lymphocytes Absolute Automated 1.26 x10 3/uL      Monocytes Absolute Automated 0.66 x10 3/uL      Eosinophils Absolute Automated 0.13 x10 3/uL      Basophils Absolute Automated 0.04 x10 3/uL      Immature Granulocytes Absolute 0.04 x10 3/uL      Absolute NRBC 0.00 x10 3/uL             Rads:   Radiological Procedure reviewed.  Radiology Results (24 Hour)     Procedure Component Value Units Date/Time    US Abdomen Complete  [[id number]] Collected: 07/15/19 1736    Order Status: Completed Updated: 07/15/19 1742    Narrative:      HISTORY: Right upper quadrant pain    COMPARISON: None    FINDINGS:   Liver: Normal in size, contour and echotexture with no focal lesion  identified.   Pancreas: Visualized head is unremarkable.  Gallbladder: 2.4 cm mobile gallstone is present. Trace sludge.  Nonspecific borderline wall thickening measuring up to 4 mm. No  sonographic Murphy's sign. However, the patient was premedicated prior  to the study.  Bile Ducts: Normal. Common duct measures 5 mm.   Spleen: Normal.  Free Fluid: None.  Aorta: Normal.  IVC: Visualized IVC patent.  Kidneys: Normal  size and echotexture with no obstruction. Right kidney  measures 10.7 cm. Left kidney measures 10.3 cm.      Impression:        1. A 2.4 cm gallstone is present. No specific evidence of cholecystitis.  However, the patient had pain medication prior to this study which may  limit evaluation for a sonographic Murphy's sign.  2. No biliary ductal dilatation.    Carlynn Spry, MD   07/15/2019 5:40 PM    Chest AP Portable [[id number]] Collected: 07/15/19 1623    Order Status: Completed Updated: 07/15/19 1626    Narrative:      Reason for exam: 64 year old female with chest pain.    FINDINGS:  Portable AP view. 1610.  The heart size and contour are normal.  Lungs are clear with normal  pulmonary vascularity.  No pleural effusion, hilar or mediastinal  prominence is evident. There is no pneumothorax.      Impression:        No detectable active cardiopulmonary disease.        Wilmon Pali, MD   07/15/2019 4:24 PM            Assessment/ Plan:     Biliary colic due to cholelithiasis.  Keep her n.p.o.  Possible lap chole tomorrow patient on Zosyn 4.5 IV every 8 hours  History of depression  Continue Zoloft 150 once a day, Wellbutrin 150 once a day  History of scoliosis  Followed by pain clinic  Elevated blood pressure    Watch closely  UTI , urine culture pending, pt on Zoyn       Thank you for allowing me to take part in your patient's care. Will  follow  closely.      Signed by: Lurline Idol, MD      This note was generated by the Epic EMR system/ Dragon speech recognition and may contain inherent errors or omissions not intended by the user. Grammatical errors, random word insertions, deletions, pronoun errors and incomplete sentences are occasional consequences of this technology due to software limitations. Not all errors are caught or corrected. If there are questions or concerns about the content of this note or information contained within the body of this dictation they should be addressed directly with the author for clarification

## 2019-07-15 NOTE — ED Provider Notes (Signed)
[facility]       [facility]  EMERGENCY DEPARTMENT HISTORY AND PHYSICAL EXAM    Patient Information     Patient Name: Krista Gray, Krista Gray  Encounter Date:  07/15/2019  Patient DOB:  Nov 24, 1955  MRN:  [id number]  Room:  07/A07  Rendering Provider: Delice Bison A. Jeannetta Nap, PA-C, CAQ-EM    History of Presenting Illness     Chief Complaint: chest/abdominal pain  Historian: pt  Onset: gradual, 10am  Quality: worsening, sharp  Location: upper abdomen   Duration: x5.5 hrs      HPI Comments:   64 y.o. female former smoker h/o HTN, DM, depression, anxiety c/o gradual onset of progressively worsening sharp upper abdominal pain x5.5 hours.  Patient states pain began this morning around 10 AM while seated getting a pedicure.  She describes the pain as feeling "excruciating".  She says it has been constant throughout the day, but seems to be getting worse.  Associated symptoms include nausea.  No fever, headache, vision changes, syncope, chest pain, shortness of breath, cough, hemoptysis, vomiting, back pain, leg pain or swelling.  No radiation of pain into the chest, neck, jaw, down the arm, or into the back.  Patient recently drove here from [place].  No recent surgeries.  No history of DVT/PE.  Occasional NSAID use.  Drinks wine daily, but not to excess.  No other complaints.     PMD: Pcp, Notonfile, MD    Past Medical History     Past Medical History:   Diagnosis Date    Anxiety     Depression     Diabetes mellitus     Hypertension        Past Surgical History     Past Surgical History:   Procedure Laterality Date    ABDOMINAL SURGERY      COSMETIC SURGERY         Family History     Family History   Problem Relation Age of Onset    Diabetes Mother     Hypertension Mother     Osteoporosis Mother        Social History     Social History     Socioeconomic History    Marital status: Married     Spouse name: Not on file    Number of children: Not on file    Years of education: Not on  file    Highest education level: Not on file   Occupational History    Not on file   Social Needs    Financial resource strain: Not on file    Food insecurity     Worry: Not on file     Inability: Not on file    Transportation needs     Medical: Not on file     Non-medical: Not on file   Tobacco Use    Smoking status: Former Smoker    Smokeless tobacco: Never Used   Substance and Sexual Activity    Alcohol use: No     Alcohol/week: 0.0 standard drinks    Drug use: No    Sexual activity: Not on file   Lifestyle    Physical activity     Days per week: Not on file     Minutes per session: Not on file    Stress: Not on file   Relationships    Social connections     Talks on phone: Not on file     Gets together: Not  on file     Attends religious service: Not on file     Active member of club or organization: Not on file     Attends meetings of clubs or organizations: Not on file     Relationship status: Not on file    Intimate partner violence     Fear of current or ex partner: Not on file     Emotionally abused: Not on file     Physically abused: Not on file     Forced sexual activity: Not on file   Other Topics Concern    Not on file   Social History Narrative    Not on file   Spouse at bedside  Visiting from [place]  Drinks wine daily, but not to excess     Allergies     Allergies   Allergen Reactions    Dust Mite Extract     Other      MSG    Pollen Extract        Home Medications     Prior to Admission medications    Medication Sig Start Date End Date Taking? Authorizing Provider   buPROPion XL (WELLBUTRIN XL) 150 MG 24 hr tablet Take 1 tablet (150 mg total) by mouth daily 02/20/19   Delfin Gant, MD   Cholecalciferol (Vitamin D3) 2000 UNIT capsule Vitamin D3    [provider]   LORazepam (ATIVAN) 0.5 MG tablet Take 1 tablet by mouth daily    [provider]   nabumetone (RELAFEN) 750 MG tablet Take 1 tablet (750 mg total) by mouth 2 (two) times daily as needed for Pain 02/20/19    Delfin Gant, MD   sertraline (ZOLOFT) 100 MG tablet Take 1 tablet (100 mg total) by mouth daily Along with 50 mg QD 02/20/19   Delfin Gant, MD   sertraline (ZOLOFT) 50 MG tablet Take 1 tablet (50 mg total) by mouth daily Along with 100 mg QD 02/20/19   Delfin Gant, MD   traZODone (DESYREL) 50 MG tablet TAKE ONE AND ONE-HALF TABLETS BY MOUTH BEDTIME 05/06/19   Delfin Gant, MD   valacyclovir (VALTREX) 1000 MG tablet Take 1 tablet by mouth daily    [provider]         Review of Systems     Review of Systems   Constitutional: Negative for fever.   Eye: No vision changes.  Cardiovascular: Positive for chest/abd pain. Negative for syncope.   Respiratory: Negative for cough, hemoptysis, and shortness of breath.   Gastrointestinal: Positive for nausea and abd pain. No vomiting.  GU: Positive for dysuria.  Musculoskeletal: No neck, jaw, arm or back pain. No leg pain or swelling.  Neuro: Negative for headache.   All other systems reviewed and are negative.      Physical Exam     Patient Vitals for the past 24 hrs:   BP Temp Temp src Pulse Resp SpO2 Weight   07/15/19 1853 (!) 164/92 98.8 F (37.1 C) Oral 88 16 95 %    07/15/19 1740 (!) 184/95   90 18 94 %    07/15/19 1649 (!) 172/99   89 16 95 %    07/15/19 1528 152/70   (!) 57 20 97 %    07/15/19 1527 182/86   (!) 57 20 97 %    07/15/19 1521  98.2 F (36.8 C) Oral       07/15/19 1517 130/79  Oral 61  18 94 % 93 kg   07/15/19 1510     18       Physical Exam   Nursing note and vitals reviewed.   Constitutional: Pt is oriented to person, place, and time. Pt appears well-developed and well-nourished. Looks uncomfortable.    HENT:     Head: Normocephalic and atraumatic.     Right Ear: External ear normal.     Left Ear: External ear normal.     Mouth/Throat: Normal speech. (Wearing a mask).  Eyes: Conjunctivae normal. No drainage. EOMI.  Neck: Normal range of motion.   Cardiovascular: Regular rate and rhythm. No murmur. 2+ DR  pulses BUE. 2+ PT pulses BLE.   Pulmonary/Chest: Vesicular breath sounds throughout. No respiratory distress. O2 sat 97% on RA.   Abdominal: Bowel sounds are normal. Abdomen soft. +TTP across the upper abdomen. No lower abdominal tenderness.  Pelvic/GU: n/a  MSK: No LE edema. No calf tenderness. Negative Homan's sign BLE. 2+ PT pulses BLE.  Neurological: Pt is alert and oriented to person, place, and time. GCS 15.    Skin: Skin is warm and dry. Normal skin turgor. No diaphoresis.   Psychiatric: Normal mood and affect. Behavior is normal.         Orders Placed During This Encounter     Orders Placed This Encounter   Procedures    Urine Culture    COVID-19 (SARS-COV-2) ([place] [facility])- for patients at [facility] and [facility]    Chest AP Portable    US Abdomen Complete    Basic Metabolic Panel    CBC and differential    Troponin I    Hepatic function panel (LFT)    Lipase    D-Dimer    UA with reflex to micro (pts  3 + yrs)    GFR    Troponin I    Diet clear liquid    Cardiac monitoring (ED ONLY)    Full Code    ED Unit Sec Comm Order    ECG 12 lead    Saline lock IV    Adult Admit to Observation       ED Medications Administered     ED Medication Orders (From admission, onward)    Start Ordered     Status Ordering Provider    07/15/19 1837 07/15/19 1837  piperacillin-tazobactam (ZOSYN) 4.5 g in sodium chloride 0.9 % 100 mL IVPB mini-bag plus  Once     Route: Intravenous  Ordered Dose: 4.5 g     Last MAR action: [employer], Lurlene Ronda A    07/15/19 1816 07/15/19 1815  morphine injection 4 mg  Once     Route: Intravenous  Ordered Dose: 4 mg     Last MAR action: Given Deborah Chalk A    07/15/19 1548 07/15/19 1547  sodium chloride 0.9 % bolus 1,000 mL  Once     Route: Intravenous  Ordered Dose: 1,000 mL     Last MAR action: [employer], Brielyn Bosak A    07/15/19 1547 07/15/19 1546  morphine injection 4 mg  Once     Route: Intravenous  Ordered Dose: 4 mg     Last MAR action: Given Alyah Boehning A    07/15/19 1547 07/15/19  1546  ondansetron (ZOFRAN) injection 4 mg  Once     Route: Intravenous  Ordered Dose: 4 mg     Last MAR action: Given Deborah Chalk A          Diagnostic Study  Results and Krista Gray Review     The results of the diagnostic studies below were reviewed by the ED provider:    Labs  Results     Procedure Component Value Units Date/Time    Troponin I [[id number]] Collected: 07/15/19 1606    Specimen: Blood Updated: 07/15/19 1645     Troponin I <0.01 ng/mL     Hepatic function panel (LFT) [[id number]]  (Abnormal) Collected: 07/15/19 1606    Specimen: Blood Updated: 07/15/19 1639     Bilirubin, Total 0.8 mg/dL      Bilirubin Direct 0.5 mg/dL      Bilirubin Indirect 0.3 mg/dL      AST (SGOT) 78 U/L      ALT 74 U/L      Alkaline Phosphatase 103 U/L      Protein, Total 7.3 g/dL      Albumin 4.5 g/dL      Globulin 2.8 g/dL      Albumin/Globulin Ratio 1.6    Lipase [[id number]] Collected: 07/15/19 1606    Specimen: Blood Updated: 07/15/19 1639     Lipase 68 U/L     GFR [[id number]] Collected: 07/15/19 1606     Updated: 07/15/19 1639     EGFR >60.0    Basic Metabolic Panel [[id number]]  (Abnormal) Collected: 07/15/19 1606    Specimen: Blood Updated: 07/15/19 1639     Glucose 135 mg/dL      BUN 19 mg/dL      Creatinine 0.9 mg/dL      Calcium 9.8 mg/dL      Sodium 244 mEq/L      Potassium 3.9 mEq/L      Chloride 104 mEq/L      CO2 25 mEq/L      Anion Gap 11.0    Urine Culture [[id number]] Collected: 07/15/19 1606    Specimen: Urine, Clean Catch Updated: 07/15/19 1636    Narrative:      Replace urinary catheter prior to obtaining the urine culture  if it has been in place for greater than or equal to 14  days:->N/A No Foley  Indications for Urine Culture:->Suprapubic Pain/Tenderness or  Dysuria    D-Dimer [[id number]] Collected: 07/15/19 1606     Updated: 07/15/19 1624     D-Dimer 0.47 ug/mL FEU     UA with reflex to micro (pts  3 + yrs) [[id number]]  (Abnormal) Collected: 07/15/19 1606    Specimen: Urine Updated: 07/15/19 1621     Urine Type  Clean Catch     Color, UA Yellow     Clarity, UA Hazy     Specific Gravity UA 1.020     Urine pH 6.0     Leukocyte Esterase, UA Moderate     Nitrite, UA Negative     Protein, UR Negative     Glucose, UA Negative     Ketones UA Trace     Urobilinogen, UA Normal mg/dL      Bilirubin, UA Negative     Blood, UA Negative     RBC, UA 3 - 5 /hpf      WBC, UA 26 - 50 /hpf      Squamous Epithelial Cells, Urine 0 - 5 /hpf      Trans Epithel, UA 0 - 2 /hpf     CBC and differential [[id number]]  (Abnormal) Collected: 07/15/19 1606    Specimen: Blood Updated: 07/15/19 1613     WBC 4.81 x10 3/uL      Hgb 15.1 g/dL  Hematocrit 47.0 %      Platelets 234 x10 3/uL      RBC 5.26 x10 6/uL      MCV 89.4 fL      MCH 28.7 pg      MCHC 32.1 g/dL      RDW 15 %      MPV 9.1 fL      Neutrophils 55.8 %      Lymphocytes Automated 26.2 %      Monocytes 13.7 %      Eosinophils Automated 2.7 %      Basophils Automated 0.8 %      Immature Granulocytes 0.8 %      Nucleated RBC 0.0 /100 WBC      Neutrophils Absolute 2.68 x10 3/uL      Lymphocytes Absolute Automated 1.26 x10 3/uL      Monocytes Absolute Automated 0.66 x10 3/uL      Eosinophils Absolute Automated 0.13 x10 3/uL      Basophils Absolute Automated 0.04 x10 3/uL      Immature Granulocytes Absolute 0.04 x10 3/uL      Absolute NRBC 0.00 x10 3/uL           Radiologic Studies  Radiology Results (24 Hour)     Procedure Component Value Units Date/Time    US Abdomen Complete [[id number]] Collected: 07/15/19 1736    Order Status: Completed Updated: 07/15/19 1742    Narrative:      HISTORY: Right upper quadrant pain    COMPARISON: None    FINDINGS:   Liver: Normal in size, contour and echotexture with no focal lesion  identified.   Pancreas: Visualized head is unremarkable.  Gallbladder: 2.4 cm mobile gallstone is present. Trace sludge.  Nonspecific borderline wall thickening measuring up to 4 mm. No  sonographic Murphy's sign. However, the patient was premedicated prior  to the study.  Bile  Ducts: Normal. Common duct measures 5 mm.   Spleen: Normal.  Free Fluid: None.  Aorta: Normal.  IVC: Visualized IVC patent.  Kidneys: Normal size and echotexture with no obstruction. Right kidney  measures 10.7 cm. Left kidney measures 10.3 cm.      Impression:        1. A 2.4 cm gallstone is present. No specific evidence of cholecystitis.  However, the patient had pain medication prior to this study which may  limit evaluation for a sonographic Murphy's sign.  2. No biliary ductal dilatation.    Carlynn Spry, MD   07/15/2019 5:40 PM    Chest AP Portable [[id number]] Collected: 07/15/19 1623    Order Status: Completed Updated: 07/15/19 1626    Narrative:      Reason for exam: 64 year old female with chest pain.    FINDINGS:  Portable AP view. 1610.  The heart size and contour are normal.  Lungs are clear with normal  pulmonary vascularity.  No pleural effusion, hilar or mediastinal  prominence is evident. There is no pneumothorax.      Impression:        No detectable active cardiopulmonary disease.        Wilmon Pali, MD   07/15/2019 4:24 PM            Abnormal results/incidental findings discussed with pt and/or family: yes    Monitors, EKG, Procedures, Critical Care, and Splints     Cardiac Monitor Interpretation: Sinus rhythm at 77 bpm. No ectopy.   EKG: Sinus bradycardia at 57 bpm. Normal axis. No acute ST  or T wave changes. EKG reviewed by MD Arvilla Market.    MDM and Clinical Notes     Working Differential (not completely inclusive): biliary colic, cholecystitis, pancreatitis, ACS/MI, dissection, PE, etc    Nursing records reviewed and agree: Yes, however, pain seems to be more in the upper abdomen rather than the chest as written in triage note    Clinical Notes: Pt is a 64 y/o F with sharp upper abd pain since this morning, worsening throughout the day. No fever. No real chest pain (points to upper abdomen when describing the pain). Denies feeling SOB. On exam, pt mildly uncomfortable. Heart and lung exam wnl. Abdomen  soft, but TTP in the epigastric area and RUQ. Recc check basic labs, abd [place], and UA. Will also check CXR, EKG, ddimer and trop as a precaution, however, ACS/MI seemingly less likely. Pt in agreement.     Heart Score      Value   History  0   EKG  0   Risk Factors  1   Total (with age)  2   Onset of pain (time of START of last episode of chest pain)?  3-6 hrs ago        White count normal  H/H stable  Platelets normal  Cr wnl  Mildly elevated LFTs  Bili and lipase normal  Trop negative x2  Ddimer neg in this pt with no PE RF  UA suggestive of infxn - culture added     CXR neg    Re-Eval: Re-eval after morphine - Pt requesting more pain meds. Will order.    US shows single large gall stone and some GB wall thickening; having continued pain; will d/w surgery      Phone Conversation: Spoke w/ Dr. Renee Harder (general surgery) - Case discussed. Accepts for admission. Okay w/ abx. Requests medicine consult.    At the time of admission, available clinical Krista Gray was discussed with the admitting physician.  The admitting physician requests obs status.      Spoke w/ Dr. Fawn Kirk (medicine) - Case discussed. Will consult on pt.    All results r/w pt. D/w pt plans for admission for surgical evaluation. She's in agreement. Questions answered.     Personal Protective Equipment:  I was wearing the following personal protective equipment at the time of patient evaluation and for all face-to-face interactions:    Regular surgical mask, face shield, surgical cap, and gloves.    Pandemic Caveat:  The patient was seen and evaluated during the time of COVID pandemic.  Significant limitations can be present in the evaluation and management of emergency department patients during pandemic conditions, including but not limited to lack of testing, rapidly changing IHS protocols, and/or limited follow-up resources.         Prescriptions       New Prescriptions    No medications on file       Diagnosis and Disposition     Clinical Impression:  1.  Biliary colic    2. Acute lower urinary tract infection      Final diagnoses:   Biliary colic   Acute lower urinary tract infection       Disposition:  ED Disposition     ED Disposition Condition Date/Time Comment    Observation  Tue Jul 15, 2019  6:37 PM Admitting Physician: Daryl Eastern [[id number]]   Service:: Surgery [[id number]]   Estimated Length of Stay: < 2 midnights   Tentative Discharge Plan?: Home or Self Care [  1]   Does patient need telemetry?: No                  Rendering Provider: Cyndy Freeze. Jeannetta Nap, PA-C, CAQ-EM    Attending's signature signifies review of the provider note and clinical impression.      This note was generated by the [facility] EMR system/Dragon speech recognition and may contain inherent errors or omissions not intended by the user. Grammatical errors, random word insertions, deletions, pronoun errors and incomplete sentences are occasional consequences of this technology due to software limitations. Not all errors are caught or corrected. If there are questions or concerns about the content of this note or information contained within the body of this dictation they should be addressed directly with the author for clarification.           Nigel Sloop, [place]  07/16/19 0025       Elray Mcgregor, MD  07/16/19 Jacinta Shoe

## 2019-07-15 NOTE — ED Notes (Signed)
[facility] EMERGENCY DEPARTMENT  ED NURSING NOTE FOR THE RECEIVING INPATIENT NURSE   ED NURSE Hoyle Sauer [phone]   ED CHARGE RN Tamela Oddi   ADMISSION INFORMATION   Malkie C [place] is a 64 y.o. female admitted with an ED diagnosis of:    1. Biliary colic    2. Acute lower urinary tract infection         Isolation: None   Allergies: Dust mite extract, Other, and Pollen extract   Holding Orders confirmed? Yes   Belongings Documented? Yes   Home medications sent to pharmacy confirmed? N/A   NURSING CARE   Patient Comes From:   Mental Status: Home Independent  alert   ADL: Independent with all ADLs   Ambulation: no difficulty   Pertinent Information  and Safety Concerns: Pt lives in [place] and up visiting family     CT / NIH   CT Head ordered on this patient?  No   NIH/Dysphagia assessment done prior to admission? No   VITAL SIGNS (at the time of this note)      Vitals:    07/15/19 1853   BP: (!) 164/92   Pulse: 88   Resp: 16   Temp: 98.8 F (37.1 C)   SpO2: 95%

## 2019-07-15 NOTE — ED Triage Notes (Signed)
stabbing mid sternal chest pain since 1000, c/o nausea as well, BP same in both arms

## 2019-07-16 ENCOUNTER — Ambulatory Visit: Payer: Self-pay

## 2019-07-16 ENCOUNTER — Encounter
Admission: EM | Disposition: A | Discharge status: Home / Self Care | Payer: Self-pay | Source: Home / Self Care | Attending: Emergency Medicine

## 2019-07-16 ENCOUNTER — Encounter: Payer: Self-pay | Admitting: Surgery

## 2019-07-16 ENCOUNTER — Observation Stay: Payer: Commercial Managed Care - POS | Admitting: Certified Registered"

## 2019-07-16 DIAGNOSIS — K8050 Calculus of bile duct without cholangitis or cholecystitis without obstruction: Secondary | ICD-10-CM

## 2019-07-16 HISTORY — PX: LAPAROSCOPIC, CHOLECYSTECTOMY: SHX4474

## 2019-07-16 LAB — TROPONIN I: Troponin I: <0.01 ng/mL (ref 0.00–0.05)

## 2019-07-16 SURGERY — LAPAROSCOPIC, CHOLECYSTECTOMY
Anesthesia: Anesthesia General | Site: Abdomen | Wound class: Clean Contaminated

## 2019-07-16 MED ORDER — NEOSTIGMINE METHYLSULFATE 1 MG/ML IJ/IV SOLN (WRAP)
Status: AC
Start: 2019-07-16 — End: ?
  Filled 2019-07-16: qty 5

## 2019-07-16 MED ORDER — GLYCOPYRROLATE 0.2 MG/ML IJ SOLN (WRAP)
INTRAMUSCULAR | Status: DC | PRN
Start: 2019-07-16 — End: 2019-07-16
  Administered 2019-07-16: .2 mg via INTRAVENOUS
  Administered 2019-07-16: .6 mg via INTRAVENOUS

## 2019-07-16 MED ORDER — PROPOFOL 10 MG/ML IV EMUL (WRAP)
INTRAVENOUS | Status: AC
Start: 2019-07-16 — End: ?
  Filled 2019-07-16: qty 20

## 2019-07-16 MED ORDER — PROMETHAZINE HCL 25 MG/ML IJ SOLN
6.2500 mg | Freq: Once | INTRAMUSCULAR | Status: DC | PRN
Start: 2019-07-16 — End: 2019-07-16

## 2019-07-16 MED ORDER — SUCCINYLCHOLINE CHLORIDE 20 MG/ML IJ SOLN
INTRAMUSCULAR | Status: AC
Start: 2019-07-16 — End: ?
  Filled 2019-07-16: qty 10

## 2019-07-16 MED ORDER — FENTANYL CITRATE (PF) 50 MCG/ML IJ SOLN (WRAP)
INTRAMUSCULAR | Status: DC | PRN
Start: 2019-07-16 — End: 2019-07-16
  Administered 2019-07-16: 100 ug via INTRAVENOUS

## 2019-07-16 MED ORDER — MIDAZOLAM HCL 1 MG/ML IJ SOLN (WRAP)
INTRAMUSCULAR | Status: AC
Start: 2019-07-16 — End: ?
  Filled 2019-07-16: qty 2

## 2019-07-16 MED ORDER — LABETALOL HCL 5 MG/ML IV SOLN (WRAP)
INTRAVENOUS | Status: DC | PRN
Start: 2019-07-16 — End: 2019-07-16
  Administered 2019-07-16: 5 mg via INTRAVENOUS

## 2019-07-16 MED ORDER — ONDANSETRON HCL 4 MG/2ML IJ SOLN
INTRAMUSCULAR | Status: AC
Start: 2019-07-16 — End: ?
  Filled 2019-07-16: qty 2

## 2019-07-16 MED ORDER — BUPIVACAINE-EPINEPHRINE (PF) 0.5% -1:200000 IJ SOLN
INTRAMUSCULAR | Status: DC | PRN
Start: 2019-07-16 — End: 2019-07-16
  Administered 2019-07-16: 20 mL

## 2019-07-16 MED ORDER — ONDANSETRON HCL 4 MG/2ML IJ SOLN
INTRAMUSCULAR | Status: DC | PRN
Start: 2019-07-16 — End: 2019-07-16
  Administered 2019-07-16: 4 mg via INTRAVENOUS

## 2019-07-16 MED ORDER — FENTANYL CITRATE (PF) 50 MCG/ML IJ SOLN (WRAP)
INTRAMUSCULAR | Status: AC
Start: 2019-07-16 — End: ?
  Filled 2019-07-16: qty 5

## 2019-07-16 MED ORDER — SODIUM CHLORIDE 0.9% BAG (IRRIGATION USE)
INTRAVENOUS | Status: DC | PRN
Start: 2019-07-16 — End: 2019-07-16
  Administered 2019-07-16: 1000 mL

## 2019-07-16 MED ORDER — EPINEPHRINE HCL 1 MG/ML IJ SOLN (WRAP)
Status: AC
Start: 2019-07-16 — End: ?
  Filled 2019-07-16: qty 1

## 2019-07-16 MED ORDER — FENTANYL CITRATE (PF) 50 MCG/ML IJ SOLN (WRAP)
25.0000 ug | INTRAMUSCULAR | Status: DC | PRN
Start: 2019-07-16 — End: 2019-07-16

## 2019-07-16 MED ORDER — AMMONIA AROMATIC IN INHA
1.0000 | Freq: Once | RESPIRATORY_TRACT | Status: DC | PRN
Start: 2019-07-16 — End: 2019-07-16

## 2019-07-16 MED ORDER — ROCURONIUM BROMIDE 50 MG/5ML IV SOLN
INTRAVENOUS | Status: DC | PRN
Start: 2019-07-16 — End: 2019-07-16
  Administered 2019-07-16: 10 mg via INTRAVENOUS
  Administered 2019-07-16: 20 mg via INTRAVENOUS

## 2019-07-16 MED ORDER — NEOSTIGMINE METHYLSULFATE 1 MG/ML IJ/IV SOLN (WRAP)
Status: DC | PRN
Start: 2019-07-16 — End: 2019-07-16
  Administered 2019-07-16: 1 mg via INTRAVENOUS
  Administered 2019-07-16: 4 mg via INTRAVENOUS

## 2019-07-16 MED ORDER — PROPOFOL INFUSION 10 MG/ML
INTRAVENOUS | Status: DC | PRN
Start: 2019-07-16 — End: 2019-07-16
  Administered 2019-07-16: 50 mg via INTRAVENOUS
  Administered 2019-07-16: 200 mg via INTRAVENOUS

## 2019-07-16 MED ORDER — DEXAMETHASONE SODIUM PHOSPHATE 4 MG/ML IJ SOLN
INTRAMUSCULAR | Status: AC
Start: 2019-07-16 — End: ?
  Filled 2019-07-16: qty 1

## 2019-07-16 MED ORDER — HYDROMORPHONE HCL 0.5 MG/0.5 ML IJ SOLN
0.5000 mg | INTRAMUSCULAR | Status: DC | PRN
Start: 2019-07-16 — End: 2019-07-16

## 2019-07-16 MED ORDER — LIDOCAINE HCL 2 % IJ SOLN
INTRAMUSCULAR | Status: DC | PRN
Start: 2019-07-16 — End: 2019-07-16
  Administered 2019-07-16: 40 mg

## 2019-07-16 MED ORDER — ONDANSETRON HCL 4 MG/2ML IJ SOLN
4.0000 mg | Freq: Once | INTRAMUSCULAR | Status: DC | PRN
Start: 2019-07-16 — End: 2019-07-16

## 2019-07-16 MED ORDER — BUPIVACAINE HCL (PF) 0.5 % IJ SOLN
INTRAMUSCULAR | Status: AC
Start: 2019-07-16 — End: ?
  Filled 2019-07-16: qty 30

## 2019-07-16 MED ORDER — HYDROCODONE-ACETAMINOPHEN 5-325 MG PO TABS
1.00 | ORAL_TABLET | Freq: Four times a day (QID) | ORAL | 0 refills | Status: AC | PRN
Start: 2019-07-16 — End: 2019-07-23

## 2019-07-16 MED ORDER — MORPHINE SULFATE 2 MG/ML IJ/IV SOLN (WRAP)
2.0000 mg | Status: DC | PRN
Start: 2019-07-16 — End: 2019-07-16

## 2019-07-16 MED ORDER — ROCURONIUM BROMIDE 50 MG/5ML IV SOLN
INTRAVENOUS | Status: AC
Start: 2019-07-16 — End: ?
  Filled 2019-07-16: qty 5

## 2019-07-16 MED ORDER — SUCCINYLCHOLINE CHLORIDE 20 MG/ML IJ SOLN
INTRAMUSCULAR | Status: DC | PRN
Start: 2019-07-16 — End: 2019-07-16
  Administered 2019-07-16: 140 mg via INTRAVENOUS

## 2019-07-16 MED ORDER — MIDAZOLAM HCL 1 MG/ML IJ SOLN (WRAP)
INTRAMUSCULAR | Status: DC | PRN
Start: 2019-07-16 — End: 2019-07-16
  Administered 2019-07-16: 2 mg via INTRAVENOUS

## 2019-07-16 MED ORDER — DEXAMETHASONE SODIUM PHOSPHATE 4 MG/ML IJ SOLN (WRAP)
INTRAMUSCULAR | Status: DC | PRN
Start: 2019-07-16 — End: 2019-07-16
  Administered 2019-07-16: 4 mg via INTRAVENOUS

## 2019-07-16 SURGICAL SUPPLY — 57 items
ADHESIVE SKIN CLOSURE DERMABOND ADVANCED (Skin Closure) ×1 IMPLANT
ADHESIVE SKIN CLOSURE DERMABOND ADVANCED .7 ML LIQUID APPLICATOR (Skin Closure) ×1 IMPLANT
ADHESIVE SKNCLS 2 OCTYL CYNCRLT .7ML (Skin Closure) ×2
ELECTRODE ADULT PATIENT RETURN L9 FT REM POLYHESIVE ACRYLIC FOAM (Procedure Accessories) ×1 IMPLANT
ELECTRODE PATIENT RETURN L9 FT VALLEYLAB (Procedure Accessories) ×1 IMPLANT
ELECTRODE PT RTN RM PHSV ACRL FM C30- LB (Procedure Accessories) ×2
GLOVE SRG 8 BGL OPTIFIT ORTH LTX STRL PF (Glove) ×2
GLOVE SURGICAL 8 BIOGEL OPTIFIT POWDER (Glove) ×1 IMPLANT
GLOVE SURGICAL 8 BIOGEL OPTIFIT POWDER FREE ROUGH BEAD CUFF (Glove) ×1 IMPLANT
HANDLE LGHT LF STRL ADP LGHT CNTRL + TCH (Other) ×2
HANDLE LIGHT ADAPTIVE LIGHT CONTROL PLUS (Other) ×1 IMPLANT
HANDLE LIGHT ADAPTIVE LIGHT CONTROL PLUS TECHNOLOGY SNAP ON LENS TOUCH (Other) ×1 IMPLANT
KIT INFECTION CONTROL CUSTOM (Kits) ×2 IMPLANT
KIT INFECTION CONTROL CUSTOM IFOH03 (Kits) ×1 IMPLANT
LIGATOR ESCP PDS2 0 E-LP 18IN LF MFL TIE (Suture) ×2
LIGATOR L18 IN ENDOLOOP MONOFILAMENT TIE (Suture) ×1 IMPLANT
LIGATOR L18 IN ENDOLOOP MONOFILAMENT TIE LOOP SUTURE ENDOSCOPIC PDS II (Suture) ×1 IMPLANT
MANIFOLD SCT 2 STD NPTN 2 LF NS 4 PORT (Filter) ×2
MANIFOLD SUCTION 2 STANDARD 4 PORT (Filter) ×1 IMPLANT
MANIFOLD SUCTION 2 STANDARD 4 PORT NEPTUNE 2 WASTE MANAGEMENT SYSTEM (Filter) ×1 IMPLANT
NEEDLE HPO SS PP RW BD 25GA 1.5IN LF (Needles) ×2 IMPLANT
PACK SURGICAL GEN LAPAROSCOPY FFX (Pack) ×1 IMPLANT
SET HIGH FLOW SMOKE EVACUATION (Tubing) ×1 IMPLANT
SET HIGH FLOW SMOKE EVACUATION PNEUMOCLEAR TUBING (Tubing) ×1 IMPLANT
SET TUBING PNEUMOCLEAR HFLO SMK EVAC (Tubing) ×2
SHEARS HARMONIC ACE+7 36CM (Instrument) ×2 IMPLANT
SLEEVE LAPAROSCOPIC L75 MM UNIVERSAL (Endoscopic Supplies) ×2 IMPLANT
SLEEVE LAPAROSCOPIC L75 MM UNIVERSAL STABILITY CANNULA RADIOPAQUE (Endoscopic Supplies) ×2 IMPLANT
SLEEVE LAPSCP UNV EPTH XCL 5MM 75MM LF (Endoscopic Supplies) ×4
SOLUTION ANFG ISOPRPNL FM DVN DFGR FGOUT (Procedure Accessories) ×2
SOLUTION ANTIFOG NONTOXIC NONFLAMMABLE (Procedure Accessories) ×1 IMPLANT
SOLUTION ANTIFOG NONTOXIC NONFLAMMABLE PAD DEVON ISOPROPANOL FOAM (Procedure Accessories) ×1 IMPLANT
SOLUTION IRR 0.9% NACL 1000ML LF STRL (Irrigation Solutions) ×2
SOLUTION IRRIGATION 0.9% SODIUM CHLORIDE (Irrigation Solutions) ×1 IMPLANT
SOLUTION IRRIGATION 0.9% SODIUM CHLORIDE 1000 ML PLASTIC POUR BOTTLE (Irrigation Solutions) ×1 IMPLANT
SOLUTION PRP 4% CHG 4OZ SCR CR EXDN ANMC (Scrub Supplies) ×2 IMPLANT
SUTURE ABS 0 UR-6 VCL 27IN BRD COAT VIOL (Suture) ×2
SUTURE ABS 5-0 P-3 MNCRL MTPS 18IN MFL (Suture) ×4
SUTURE COATED VICRYL 0 UR-6 L27 IN BRAID (Suture) ×1 IMPLANT
SUTURE MONOCRYL 5-0 P-3 L18 IN (Suture) ×2 IMPLANT
SUTURE MONOCRYL 5-0 P-3 L18 IN MONOFILAMENT UNDYED ABSORBABLE (Suture) ×2 IMPLANT
SYRINGE 10 ML GRADUATE NONPYROGENIC DEHP (Syringes, Needles) ×1 IMPLANT
SYRINGE 10 ML GRADUATE NONPYROGENIC DEHP FREE PVC FREE LOK MEDICAL (Syringes, Needles) ×1 IMPLANT
SYRINGE MED 10ML LL LF STRL GRAD N-PYRG (Syringes, Needles) ×2
TRAY SKIN SCRUB L8 IN 6 WING 6 SPONGE STICK 2 TIP APPLICATOR DRY VINYL (Prep) ×1 IMPLANT
TRAY SKIN SCRUB MEDLINE L8 IN VINYL (Prep) ×1 IMPLANT
TRAY SKN SCRB VNYL CTTN 8IN LF STRL 6 (Prep) ×2
TRAY SRG GEN LAPAROSCOPY IFMC (Pack) ×2
TROCAR LAPAROSCOPIC BLADELESS STABILITY SLEEVE L75 MM OD5 MM ENDOPATH (Laparoscopy Supplies) ×1 IMPLANT
TROCAR LAPAROSCOPIC SMOOTH SLEEVE BLUNT TIP OBTURATOR PISTOL HANDLE (Laparoscopy Supplies) ×1 IMPLANT
TROCAR LAPAROSCOPIC SMOOTH SLV BLNT  12MM (Laparoscopy Supplies) ×1 IMPLANT
TROCAR LAPAROSCOPIC STABILITY SLEEVE (Laparoscopy Supplies) ×1 IMPLANT
TROCAR LAPSCP EPTH XCL 12MM 100MM STRL (Laparoscopy Supplies) ×2
TROCAR LAPSCP EPTH XCL 5MM 75MM LF STRL (Laparoscopy Supplies) ×2
TUBING SCT PVC ARG 3/16IN 10FT LF STRL (Tubing) ×2
TUBING SUCTION ID3/16 IN L10 FT (Tubing) ×1 IMPLANT
TUBING SUCTION ID3/16 IN L10 FT NONCONDUCTIVE STRAIGHT MALE FEMALE (Tubing) ×1 IMPLANT

## 2019-07-16 NOTE — Progress Notes (Signed)
Pt arrived from ED ~2015. Masimo in place. SCDs in place. IVF running. Voiding adequately. Pt stated pain to abdomen is moderate given- PRN morphine and pt is resting comfortably. No c/o N/V. Pt was oriented to the room. Pt acknowledges to call for assistance as needed- call bell, phone, and bedside table within reach. Bed in lowest position with alarm activated. Will continue to monitor.

## 2019-07-16 NOTE — Progress Notes (Signed)
Pt is A&Ox4 and stable. Pain 7/10, IV dilaudid given. Surgery is currently scheduled for 9 AM. NPO since midnight. Transport coming up for pt shortly.

## 2019-07-16 NOTE — Anesthesia Postprocedure Evaluation (Signed)
Anesthesia Post Evaluation    Patient: [name] [place]    Procedure(s):  LAPAROSCOPIC, CHOLECYSTECTOMY    Anesthesia type: general    Last Vitals:   Vitals Value Taken Time   BP 131/69 07/16/19 1030   Temp 36.2 C (97.2 F) 07/16/19 1000   Pulse 70 07/16/19 1100   Resp 14 07/16/19 1040   SpO2 90 % 07/16/19 1100                 Anesthesia Post Evaluation:     Patient Evaluated: PACU  Patient Participation: complete - patient participated  Level of Consciousness: awake and sleepy but conscious  Pain Score: 0  Pain Management: adequate    Airway Patency: patent    Anesthetic complications: No      PONV Status: none    Cardiovascular status: acceptable  Respiratory status: acceptable and face mask  Hydration status: acceptable        Signed by: Briant Cedar, 07/16/2019 11:04 AM

## 2019-07-16 NOTE — Plan of Care (Signed)
Problem: Safety  Goal: Patient will be free from injury during hospitalization  Outcome: Progressing  Flowsheets (Taken 07/16/2019 0050 by Rachell Cipro, RN)  Patient will be free from injury during hospitalization:   Assess patient's risk for falls and implement fall prevention plan of care per policy   Use appropriate transfer methods   Provide and maintain safe environment   Ensure appropriate safety devices are available at the bedside   Include patient/ family/ care giver in decisions related to safety   Hourly rounding     Problem: Pain  Goal: Pain at adequate level as identified by patient  Outcome: Progressing  Flowsheets (Taken 07/16/2019 0050 by Rachell Cipro, RN)  Pain at adequate level as identified by patient:   Assess for risk of opioid induced respiratory depression, including snoring/sleep apnea. Alert healthcare team of risk factors identified.   Assess pain on admission, during daily assessment and/or before any "as needed" intervention(s)   Reassess pain within 30-60 minutes of any procedure/intervention, per Pain Assessment, Intervention, Reassessment (AIR) Cycle   Identify patient comfort function goal   Evaluate if patient comfort function goal is met   Evaluate patient's satisfaction with pain management progress   Offer non-pharmacological pain management interventions     Problem: Side Effects from Pain Analgesia  Goal: Patient will experience minimal side effects of analgesic therapy  Outcome: Progressing  Flowsheets (Taken 07/16/2019 0050 by Rachell Cipro, RN)  Patient will experience minimal side effects of analgesic therapy:   Monitor/assess patient's respiratory status (RR depth, effort, breath sounds)   Assess for changes in cognitive function   Prevent/manage side effects per LIP orders (i.e. nausea, vomiting, pruritus, constipation, urinary retention, etc.)   Evaluate for opioid-induced sedation with appropriate assessment tool (i.e. POSS)

## 2019-07-16 NOTE — Plan of Care (Signed)
NURSE NOTE SUMMARY  [facility] - [facility]NEW ORTHOPEDICS   Patient Name: Krista Gray   Attending Physician: Daryl Eastern, MD   Today's date:   07/16/2019 LOS: 0 days   Shift Summary:                                                              Pt VSS on RA, A/Ox4. Pt stated pain was moderate to abdomen and given PRN medication w/ relief. Pt is independent, but SBA d/t medication and IV, Masimo, and SCDs. Pt has ambulated in the room and plans to ambulate in hallway in AM. Will plan to give CHG bath before surgery. Pt is currently NPO except for small sips to take medication. IVF running. Voiding adequately. Pt stated she had some nausea, but currently denies N/V, wctm.    Provider Notifications:      Rapid Response Notifications:  Mobility:      PMP Activity: Step 6 - Walks in Room (07/15/2019  9:00 PM)     Weight tracking:  Family Dynamic:   Last 3 Weights for the past 72 hrs (Last 3 readings):   Weight   07/15/19 1517 93 kg (205 lb 0.4 oz)             Recent Vitals Last Bowel Movement   BP: 134/86 (07/16/2019 12:22 AM)  Heart Rate: 80 (07/16/2019 12:22 AM)  Temp: 98.6 F (37 C) (07/16/2019 12:22 AM)  Resp Rate: 14 (07/16/2019 12:22 AM)  Weight: 93 kg (205 lb 0.4 oz) (07/15/2019  3:17 PM)  SpO2: 92 % (07/16/2019 12:22 AM)   Last BM Date: 07/14/19         Problem: Safety  Goal: Patient will be free from injury during hospitalization  Flowsheets (Taken 07/16/2019 0050)  Patient will be free from injury during hospitalization:   Assess patient's risk for falls and implement fall prevention plan of care per policy   Use appropriate transfer methods   Provide and maintain safe environment   Ensure appropriate safety devices are available at the bedside   Include patient/ family/ care giver in decisions related to safety   Hourly rounding     Problem: Pain  Goal: Pain at adequate level as identified by patient  Flowsheets (Taken 07/16/2019 0050)  Pain at adequate level as identified by patient:   Assess for risk  of opioid induced respiratory depression, including snoring/sleep apnea. Alert healthcare team of risk factors identified.   Assess pain on admission, during daily assessment and/or before any "as needed" intervention(s)   Reassess pain within 30-60 minutes of any procedure/intervention, per Pain Assessment, Intervention, Reassessment (AIR) Cycle   Identify patient comfort function goal   Evaluate if patient comfort function goal is met   Evaluate patient's satisfaction with pain management progress   Offer non-pharmacological pain management interventions     Problem: Side Effects from Pain Analgesia  Goal: Patient will experience minimal side effects of analgesic therapy  Flowsheets (Taken 07/16/2019 0050)  Patient will experience minimal side effects of analgesic therapy:   Monitor/assess patient's respiratory status (RR depth, effort, breath sounds)   Assess for changes in cognitive function   Prevent/manage side effects per LIP orders (i.e. nausea, vomiting, pruritus, constipation, urinary retention, etc.)   Evaluate for opioid-induced sedation with appropriate  assessment tool (i.e. POSS)

## 2019-07-16 NOTE — Op Note (Signed)
FULL OPERATIVE NOTE    Date Time: 07/16/19 9:50 AM  Patient Name: Krista Gray  Attending Physician: Daryl Eastern, MD      Date of Operation:   07/16/2019    Providers Performing:   Surgeon(s):  Rick Duff, MD    Circulator: Clent Ridges, RN  Relief Circulator: Karyl Kinnier, RN  Scrub Person: Tyson Babinski  First Assistant: Mindi Curling  Team Leader: Howard Pouch, RN  CSA, who provided continuous assistance throughout the case and was an integral part of the procedure.    Operative Procedure:   Procedure(s):  LAPAROSCOPIC, CHOLECYSTECTOMY    Preoperative Diagnosis:   Pre-Op Diagnosis Codes:     * Acute cholecystitis [K81.0]    Postoperative Diagnosis:   Post-Op Diagnosis Codes:     * Acute cholecystitis [K81.0]    Indications:   same    Operative Notes:   After appropriate consent was obtained, the patient was brought to the operating room and placed in the supine position. With continuous pulse oximetry and cardiac monitoring, and with sequential compression devices placed, the patient was given a general anesthetic and prepped and draped in the usual sterile fashion. Following the surgical pause, a standard periumbilical laparoscopic incision was made and carried to the fascia which was incised under direct vision. 0-Vicryl suture was then placed in the fascia to secure the Hasson trocar which was then placed into the abdomen under direct vision.The peritoneal cavity was then entered under direct vision as well, taking care not to damage any structures underneath the peritoneum.  After visual confirmation of intraabdominal placement was made with the camera, a pneumoperitoneum was created. After a brief review of the abdomen revealed no other pathology, two 5 mm ports were placed in the upper midline, and one 5 mm R subcostal port was placed, all under direct vision. The patient was then placed in the reverse Trendelenberg position with the left side lowered.  The  gallbladder was decompressed.  The liver was retracted cephalad and a dome down dissection was performed with the Harmonic Scalpel, hugging the gallbladder wall until we reached the upper end of Calot's triangle. The peritoneum around the triangle was carefully incised, giving the critical view.  The artery was identified within the posterior aspect of the triangle and divided with Harmonic Scalpel, leaving the gallbladder attached by the cystic duct.   The duct was then ligated with a PDS endoloop and divided.  The gallbladder was then removed. The patient was returned to the supine position. The pneumoperitoneum was released and all trocars were removed.  The bag was pulled out. The umbilical fascia was closed with 0 vicryl.   Marcaine was used as local anesthetic and the skin incisions were closed with Monocryl subcuticular sutures and glue. The sponge and needle count were correct, and a surgical debrief was performed. The patient tolerated the procedure well and left for the recovery room in good condition.    Estimated Blood Loss:   * No values recorded between 07/16/2019  8:55 AM and 07/16/2019  9:50 AM *    Implants:   * No implants in log *    Drains:   Drains: no    Specimens:     ID Type Source Tests Collected by Time Destination   A : Gallbladder Not Applicable Gallbladder SURGICAL PATHOLOGY Rick Duff, MD 07/16/2019 [phone]          Complications:   none    Signed by: Rick Duff,  MD

## 2019-07-16 NOTE — Consults (Signed)
CONSULTATION REPORT  [facility] [phone]      Consultation Date Time: 07/16/19 8:39 AM  Date of Admission: 07/15/2019     Requesting Physician: Deborah Chalk PA  Consulting Physician: Vanessa Ralphs. Zollie Beckers, MD    Consulting Service: General Surgery    Reason For Consultation:  Abdominal Pain      Impression:     Patient Active Problem List   Diagnosis   . Urinary retention   . Depression   . Obese   . AKI (acute kidney injury)   . Abnormal weight gain   . Allergic rhinitis   . Anxiety state   . Dizziness and giddiness   . Encounter for lipid screening for cardiovascular disease   . Insomnia   . Menopausal symptom   . Prediabetes   . Recurrent major depressive episodes, moderate   . Vitamin D deficiency   . Biliary colic     Acute cholecystitis  Plan:   Given the patient's current signs and symptoms, as well as radiological evidence, I recommend proceeding with a laparoscopic cholecystectomy. The risk and benefits of the procedure were outlined with the patient to include bleeding, infection, conversion to an open procedure and injury to the common bile duct among other unforeseen events. The alternatives to surgery were also discussed. The patient understood and agreed to proceed. All questions have been answered at this time.  The results of the ACS risk calculator for surgery were related to the patient and they wish to proceed.        History of Present Illness:   Krista Gray is a 64 y.o. female who  has a past medical history of Anxiety, Depression, Diabetes mellitus, and Hypertension..  She presented to the hospital with complaints of abdominal pain.  The pain is described as aching, and is located in the RUQ without radiation. Onset was 1 day ago. Symptoms have been unchanged since. Aggravating factors: none.  Alleviating factors: none. Associated symptoms: anorexia and nausea. The patient denies chills and fever.    Past Medical History:     Past Medical History:   Diagnosis Date   . Anxiety    . Depression    .  Diabetes mellitus     patient denies   . Hypertension        Past Surgical History:     Past Surgical History:   Procedure Laterality Date   . ABDOMINAL SURGERY     . COSMETIC SURGERY         Family History:     Family History   Problem Relation Age of Onset   . Diabetes Mother    . Hypertension Mother    . Osteoporosis Mother        Social History:     Social History     Socioeconomic History   . Marital status: Married     Spouse name: Not on file   . Number of children: Not on file   . Years of education: Not on file   . Highest education level: Not on file   Occupational History   . Not on file   Social Needs   . Financial resource strain: Not on file   . Food insecurity     Worry: Not on file     Inability: Not on file   . Transportation needs     Medical: Not on file     Non-medical: Not on file   Tobacco Use   . Smoking status:  Former Smoker   . Smokeless tobacco: Never Used   Substance and Sexual Activity   . Alcohol use: Yes     Alcohol/week: 3.0 standard drinks     Types: 3 Glasses of wine per week   . Drug use: No   . Sexual activity: Not on file   Lifestyle   . Physical activity     Days per week: Not on file     Minutes per session: Not on file   . Stress: Not on file   Relationships   . Social Wellsite geologist on phone: Not on file     Gets together: Not on file     Attends religious service: Not on file     Active member of club or organization: Not on file     Attends meetings of clubs or organizations: Not on file     Relationship status: Not on file   . Intimate partner violence     Fear of current or ex partner: Not on file     Emotionally abused: Not on file     Physically abused: Not on file     Forced sexual activity: Not on file   Other Topics Concern   . Not on file   Social History Narrative   . Not on file       Allergies:     Allergies   Allergen Reactions   . Dust Mite Extract    . Other      MSG   . Pollen Extract        Medications:     Prior to Admission medications    Medication  Sig Start Date End Date Taking? Authorizing Provider   buPROPion XL (WELLBUTRIN XL) 150 MG 24 hr tablet Take 1 tablet (150 mg total) by mouth daily 02/20/19  Yes Delfin Gant, MD   Cholecalciferol (Vitamin D3) 2000 UNIT capsule Take 4,000 Units by mouth daily      Yes [provider]   primidone (MYSOLINE) 50 MG tablet Take 50 mg by mouth nightly 06/12/19  Yes [provider]   sertraline (ZOLOFT) 100 MG tablet Take 1 tablet (100 mg total) by mouth daily Along with 50 mg QD 02/20/19  Yes Delfin Gant, MD   sertraline (ZOLOFT) 50 MG tablet Take 1 tablet (50 mg total) by mouth daily Along with 100 mg QD 02/20/19  Yes Delfin Gant, MD       Review of Systems:   As per the HPI.  The patient denies any additional changes to their otic, opthalmologic, dermatologic, pulmonary, cardiac, gastrointestinal, genitourinary, musculoskeletal, hematologic, constitutional, or psychiatric systems.      Physical Exam:     Vitals:    07/16/19 0735   BP: (!) 167/109   Pulse: 91   Resp: 16   Temp: 98.1 F (36.7 C)   SpO2: 91%       General appearance - alert, well appearing, and in no distress  Mental status - alert, oriented to person, place, and time, normal mood, behavior, speech, dress, motor activity, and thought processes  Eyes - pupils equal and reactive, extraocular eye movements intact  Ears - hearing grossly normal bilaterally  Nose - normal and patent, no erythema, discharge or polyps  Mouth - mucous membranes moist, pharynx normal without lesions  Neck - supple, no significant adenopathy and thyroid exam: thyroid is normal in size without nodules or tenderness  Lymphatics - no palpable  lymphadenopathy, no hepatosplenomegaly  Chest - clear to auscultation, no wheezes, rales or rhonchi, symmetric air entry  Heart - normal rate, regular rhythm, normal S1, S2, no murmurs, rubs, clicks or gallops  Abdomen - tenderness noted RUQ, no masses or distention  Extremities - peripheral pulses normal, no pedal  edema, no clubbing or cyanosis  Skin - normal coloration and turgor, no rashes, no suspicious skin lesions noted    Labs:     Results     Procedure Component Value Units Date/Time    Troponin I [[id number]] Collected: 07/16/19 0333    Specimen: Blood Updated: 07/16/19 0407     Troponin I <0.01 ng/mL     Urine Culture [[id number]] Collected: 07/15/19 1606    Specimen: Urine, Clean Catch Updated: 07/15/19 2244    Narrative:      Replace urinary catheter prior to obtaining the urine culture  if it has been in place for greater than or equal to 14  days:->N/A No Foley  Indications for Urine Culture:->Suprapubic Pain/Tenderness or  Dysuria    COVID-19 (SARS-COV-2) ([place] [facility])- for patients at [facility] and [facility] [[id number]] Collected: 07/15/19 1922    Specimen: Nasopharyngeal Swab from Nasopharynx Updated: 07/15/19 1948     Purpose of COVID testing Screening     SARS-CoV-2 Specimen Source Nasopharyngeal     SARS CoV 2 Overall Result Negative    Narrative:      o Collect and clearly label specimen type:  o Upper respiratory specimen: One Nasopharyngeal Dry Swab NO  Transport Media.  o Hand deliver to laboratory ASAP  Indication for testing->Extended care facility admission to  semi private room    Troponin I [[id number]] Collected: 07/15/19 1913    Specimen: Blood Updated: 07/15/19 1939     Troponin I <0.01 ng/mL     Troponin I [[id number]] Collected: 07/15/19 1606    Specimen: Blood Updated: 07/15/19 1645     Troponin I <0.01 ng/mL     Hepatic function panel (LFT) [[id number]]  (Abnormal) Collected: 07/15/19 1606    Specimen: Blood Updated: 07/15/19 1639     Bilirubin, Total 0.8 mg/dL      Bilirubin Direct 0.5 mg/dL      Bilirubin Indirect 0.3 mg/dL      AST (SGOT) 78 U/L      ALT 74 U/L      Alkaline Phosphatase 103 U/L      Protein, Total 7.3 g/dL      Albumin 4.5 g/dL      Globulin 2.8 g/dL      Albumin/Globulin Ratio 1.6    Lipase [[id number]] Collected: 07/15/19 1606    Specimen: Blood Updated: 07/15/19 1639     Lipase 68 U/L      GFR [[id number]] Collected: 07/15/19 1606     Updated: 07/15/19 1639     EGFR >60.0    Basic Metabolic Panel [[id number]]  (Abnormal) Collected: 07/15/19 1606    Specimen: Blood Updated: 07/15/19 1639     Glucose 135 mg/dL      BUN 19 mg/dL      Creatinine 0.9 mg/dL      Calcium 9.8 mg/dL      Sodium 016 mEq/L      Potassium 3.9 mEq/L      Chloride 104 mEq/L      CO2 25 mEq/L      Anion Gap 11.0    D-Dimer [[id number]] Collected: 07/15/19 1606     Updated: 07/15/19 1624     D-Dimer 0.47 ug/mL FEU  UA with reflex to micro (pts  3 + yrs) [[id number]]  (Abnormal) Collected: 07/15/19 1606    Specimen: Urine Updated: 07/15/19 1621     Urine Type Clean Catch     Color, UA Yellow     Clarity, UA Hazy     Specific Gravity UA 1.020     Urine pH 6.0     Leukocyte Esterase, UA Moderate     Nitrite, UA Negative     Protein, UR Negative     Glucose, UA Negative     Ketones UA Trace     Urobilinogen, UA Normal mg/dL      Bilirubin, UA Negative     Blood, UA Negative     RBC, UA 3 - 5 /hpf      WBC, UA 26 - 50 /hpf      Squamous Epithelial Cells, Urine 0 - 5 /hpf      Trans Epithel, UA 0 - 2 /hpf     CBC and differential [[id number]]  (Abnormal) Collected: 07/15/19 1606    Specimen: Blood Updated: 07/15/19 1613     WBC 4.81 x10 3/uL      Hgb 15.1 g/dL      Hematocrit 78.2 %      Platelets 234 x10 3/uL      RBC 5.26 x10 6/uL      MCV 89.4 fL      MCH 28.7 pg      MCHC 32.1 g/dL      RDW 15 %      MPV 9.1 fL      Neutrophils 55.8 %      Lymphocytes Automated 26.2 %      Monocytes 13.7 %      Eosinophils Automated 2.7 %      Basophils Automated 0.8 %      Immature Granulocytes 0.8 %      Nucleated RBC 0.0 /100 WBC      Neutrophils Absolute 2.68 x10 3/uL      Lymphocytes Absolute Automated 1.26 x10 3/uL      Monocytes Absolute Automated 0.66 x10 3/uL      Eosinophils Absolute Automated 0.13 x10 3/uL      Basophils Absolute Automated 0.04 x10 3/uL      Immature Granulocytes Absolute 0.04 x10 3/uL      Absolute NRBC 0.00 x10 3/uL            Rads:     Radiology Results (24 Hour)     Procedure Component Value Units Date/Time    US Abdomen Complete [[id number]] Collected: 07/15/19 1736    Order Status: Completed Updated: 07/15/19 1742    Narrative:      HISTORY: Right upper quadrant pain    COMPARISON: None    FINDINGS:   Liver: Normal in size, contour and echotexture with no focal lesion  identified.   Pancreas: Visualized head is unremarkable.  Gallbladder: 2.4 cm mobile gallstone is present. Trace sludge.  Nonspecific borderline wall thickening measuring up to 4 mm. No  sonographic Murphy's sign. However, the patient was premedicated prior  to the study.  Bile Ducts: Normal. Common duct measures 5 mm.   Spleen: Normal.  Free Fluid: None.  Aorta: Normal.  IVC: Visualized IVC patent.  Kidneys: Normal size and echotexture with no obstruction. Right kidney  measures 10.7 cm. Left kidney measures 10.3 cm.      Impression:        1. A 2.4 cm gallstone is present. No  specific evidence of cholecystitis.  However, the patient had pain medication prior to this study which may  limit evaluation for a sonographic Murphy's sign.  2. No biliary ductal dilatation.    Carlynn Spry, MD   07/15/2019 5:40 PM    Chest AP Portable [[id number]] Collected: 07/15/19 1623    Order Status: Completed Updated: 07/15/19 1626    Narrative:      Reason for exam: 64 year old female with chest pain.    FINDINGS:  Portable AP view. 1610.  The heart size and contour are normal.  Lungs are clear with normal  pulmonary vascularity.  No pleural effusion, hilar or mediastinal  prominence is evident. There is no pneumothorax.      Impression:        No detectable active cardiopulmonary disease.        Wilmon Pali, MD   07/15/2019 4:24 PM          Signed by: Rick Duff

## 2019-07-16 NOTE — PACU (Signed)
Pt o2 sats between 90-93%.  Dr. Reed Pandy aware. Pt okayed to go home.

## 2019-07-16 NOTE — Anesthesia Preprocedure Evaluation (Signed)
Anesthesia Evaluation    AIRWAY    Mallampati: II    TM distance: >3 FB  Neck ROM: full  Mouth Opening:full  Planned to use difficult airway equipment: No CARDIOVASCULAR    cardiovascular exam normal       DENTAL    no notable dental hx     PULMONARY    pulmonary exam normal     OTHER FINDINGS                  Relevant Problems   GU/RENAL   (+) AKI (acute kidney injury)               Anesthesia Plan    ASA 2     general               (Risks and benefits with the common expectations discussed and patient wishes to proceed.)      intravenous induction   Detailed anesthesia plan: general endotracheal        Post op pain management: per surgeon    informed consent obtained    Plan discussed with CRNA.      pertinent labs reviewed             Signed by: Briant Cedar 07/16/19 9:19 AM

## 2019-07-16 NOTE — Discharge Instr - AVS First Page (Signed)
Admit Date: 07/15/2019   Discharge Date:07/16/2019      Attending: Daryl Eastern, MD      Procedure(s) (LRB):  LAPAROSCOPIC, CHOLECYSTECTOMY (N/A)    Admission Diagnoses:   Biliary colic [K80.50]  Acute lower urinary tract infection [N39.0]      POSTOP INSTRUCTIONS-GALLBLADDER SURGERY    Patient Instructions after Gallbladder Surgery.  Please refer to the complete postop instructions for details.    WHAT TO EXPECT AT YOUR SURGICAL SITES:     Pink/reddish drainage, bruising, swelling/lump at incisions may occur and is normal.    CARE FOR THE INCISION:     Skin glue over your incisions will dissolve over the next two weeks.   If you have white tapes on the skin (steri-strips), these will fall off over 7-10 days   Shower 24 hours after your surgery.  No tub bathing, or pool/ocean for 1 week.    DIET:     Keep up with your liquids.   If your appetite has returned, eat what your system can tolerate.  We suggest a low fat diet.    ACTIVITY:     If it hurts, don't do it!   Walking & stairs are encouraged.     Avoid strenuous physical activity & lifting until cleared by your surgeon.   You may drive again once you have stopped taking prescription medication for pain.      MEDICATION:     Resume all home medications.   Use over-the-counter pain medications or a prescribed narcotic for your discomfort as needed.     For constipation, an over-the-counter stool softener or laxative may be taken.  Please refer to attached detailed instructions or our website for this issue.        WHAT TO LOOK FOR:    Please call our office immediately at [phone] or go to the ER if you develop any of the following:     Excessive drainage, bleeding, redness or swelling at or around the incisions   Fever over 101F   Increased abdominal pain   Persistent nausea or vomiting   Difficulty with urination   Difficulty breathing, chest pain or calf pain   Brown/dark yellow urine, or yellow discoloration of the skin or  eyes    FOLLOW-UP:     We will see you in our office 1-2 weeks after your surgery.   If not already scheduled, please call [phone] to arrange your post op visit.      Please refer to the attached detailed instructions for any questions or for clarification.

## 2019-07-16 NOTE — Progress Notes (Signed)
PROGRESS NOTE  Chart check.  Only patient was discharged from PACU    Date Time: 07/16/19 8:57 AM  Patient Name: Krista Gray    Patient Active Problem List   Diagnosis    Urinary retention    Depression    Obese    AKI (acute kidney injury)    Abnormal weight gain    Allergic rhinitis    Anxiety state    Dizziness and giddiness    Encounter for lipid screening for cardiovascular disease    Insomnia    Menopausal symptom    Prediabetes    Recurrent major depressive episodes, moderate    Vitamin D deficiency    Biliary colic             Assessment/ Plan:   Krista Gray [place] is a 64 y.o. female with  Biliary colic due to cholelithiasis.  Patient in OR  History of depression  Continue Zoloft 150 once a day, Wellbutrin 150 once a day  History of scoliosis  Followed by pain clinic  Elevated blood pressure    Watch closely        All labs/ notes from last 24 hrs reviewed      This note was generated by the Epic EMR system/ Dragon speech recognition and may contain inherent errors or omissions not intended by the user. Grammatical errors, random word insertions, deletions, pronoun errors and incomplete sentences are occasional consequences of this technology due to software limitations. Not all errors are caught or corrected. If there are questions or concerns about the content of this note or information contained within the body of this dictation they should be addressed directly with the author for clarification      Subjective:   Patient Seen and Examined. The notes from the last 24 hours were reviewed  Only chart checked patient was discharged from PACU after the surgery.  Patient was not seen by me today    Medications:     Current Facility-Administered Medications   Medication Dose Route Frequency    [MAR Hold] buPROPion XL  150 mg Oral Daily    [MAR Hold] piperacillin-tazobactam  4.5 g Intravenous Q8H    [MAR Hold] primidone  50 mg Oral QHS    [MAR Hold] sertraline  150 mg Oral Daily         sodium chloride      lactated ringers 100 mL/hr at 07/16/19 0801         Review of Systems:     A comprehensive review of systems was: obtained from nurse notes in chart, review                Physical Exam:     Patient Vitals for the past 24 hrs:   BP Temp Temp src Pulse Resp SpO2 Height Weight   07/16/19 0800 -- -- -- -- -- -- 1.702 m (5\' 7" ) 91.6 kg (202 lb)   07/16/19 0735 (!) 167/109 98.1 F (36.7 Gray) Oral 91 16 91 % -- --   07/16/19 0355 -- -- -- -- -- -- -- 93 kg (205 lb 0.4 oz)   07/16/19 0346 (!) 149/92 98.1 F (36.7 Gray) Oral 80 18 94 % -- --   07/16/19 0022 134/86 98.6 F (37 Gray) Oral 80 14 92 % -- --   07/15/19 2017 154/78 98.4 F (36.9 Gray) Oral 80 16 95 % -- --   07/15/19 1853 (!) 164/92 98.8 F (37.1 Gray) Oral 88 16 95 % -- --  07/15/19 1740 (!) 184/95 -- -- 90 18 94 % -- --   07/15/19 1649 (!) 172/99 -- -- 89 16 95 % -- --   07/15/19 1528 152/70 -- -- (!) 57 20 97 % -- --   07/15/19 1527 182/86 -- -- (!) 57 20 97 % -- --   07/15/19 1521 -- 98.2 F (36.8 Gray) Oral -- -- -- -- --   07/15/19 1517 130/79 -- Oral 61 18 94 % -- 93 kg (205 lb 0.4 oz)   07/15/19 1510 -- -- -- -- 18 -- -- --     Intake and Output Summary (Last 24 hours) at Date Time    Intake/Output Summary (Last 24 hours) at 07/16/2019 0857  Last data filed at 07/16/2019 0735  Gross per 24 hour   Intake 513 ml   Output 725 ml   Net -212 ml     Patient not examined    Labs:     Recent Labs   Lab 07/15/19  1606   WBC 4.81   RBC 5.26*   Hgb 15.1*   Hematocrit 47.0*   MCV 89.4   Platelets 234       Recent Labs   Lab 07/15/19  1606   Sodium 140   Potassium 3.9   Chloride 104   CO2 25   BUN 19   Creatinine 0.9   Glucose 135*   Calcium 9.8             Invalid input(s): OSMOL    Recent Labs   Lab 07/15/19  1606   ALT 74*   AST (SGOT) 78*   Bilirubin, Total 0.8   Bilirubin Direct 0.5   Albumin 4.5   Alkaline Phosphatase 103       Recent Labs   Lab 07/16/19  0333 07/15/19  1913 07/15/19  1606   Troponin I <0.01 <0.01 <0.01           Microbiology, reviewed  and are significant for:  Microbiology Results     Procedure Component Value Units Date/Time    COVID-19 (SARS-COV-2) ([facility])- for patients at [facility] and [facility] [[id number]] Collected: 07/15/19 1922    Specimen: Nasopharyngeal Swab from Nasopharynx Updated: 07/15/19 1948     Purpose of COVID testing Screening     SARS-CoV-2 Specimen Source Nasopharyngeal     SARS CoV 2 Overall Result Negative     Comment: Test performed using the Abbott ID NOW EUA assay.  Please see Fact Sheets for patients and providers located at:  http://www.rice.biz/  This test is for the qualitative detection of SARS-CoV-2  (COVID19) nucleic acid. Viral nucleic acids may persist in vivo,  independent of viability. Detection of viral nucleic acid does  not imply the presence of infectious virus, or that virus  nucleic acid is the cause of clinical symptoms. Negative  results should be treated as presumptive and, if inconsistent  with clinical signs and symptoms or necessary for patient  management, should be tested with an alternative molecular  assay. Negative results do not preclude SARS-CoV-2 infection  and should not be used as the sole basis for patient  management decisions. Invalid results may be due to inhibiting  substances in the specimen and recollection should occur.         Narrative:      o Collect and clearly label specimen type:  o Upper respiratory specimen: One Nasopharyngeal Dry Swab NO  Transport Media.  o Hand deliver to laboratory ASAP  Indication  for testing->Extended care facility admission to  semi private room    Urine Culture [[id number]] Collected: 07/15/19 1606    Specimen: Urine, Clean Catch Updated: 07/15/19 2244    Narrative:      Replace urinary catheter prior to obtaining the urine culture  if it has been in place for greater than or equal to 14  days:->N/A No Foley  Indications for Urine Culture:->Suprapubic Pain/Tenderness or  Dysuria                  No results for input(s): CXBLD in the last  24 hours.  Invalid input(s): CXURN    Recent BLOOD CULTURE No results for input(s): CXBLD in the last 24 hours.  Recent URINE CULTURE Invalid input(s): CXURN    Cardiac Enzymes   Recent Labs   Lab 07/16/19  0333 07/15/19  1913 07/15/19  1606   Troponin I <0.01 <0.01 <0.01       No results found for: BNP       Thyroid Studies          No results for input(s): CXBLD in the last 24 hours.  Invalid input(s): CXURN    Urinalysis   Recent Labs   Lab 07/15/19  1606   Urine Type Clean Catch   Color, UA Yellow   Clarity, UA Hazy   Specific Gravity UA 1.020   Urine pH 6.0   Nitrite, UA Negative   Ketones UA Trace*   Urobilinogen, UA Normal   Bilirubin, UA Negative   Blood, UA Negative   RBC, UA 3 - 5   WBC, UA 26 - 50*              MICROBIOLOGY  No results for input(s): CXBLD in the last 24 hours.  Invalid input(s): CXURN          Rads:   Radiological Procedure reviewed.  Radiology Results (24 Hour)     Procedure Component Value Units Date/Time    US Abdomen Complete [[id number]] Collected: 07/15/19 1736    Order Status: Completed Updated: 07/15/19 1742    Narrative:      HISTORY: Right upper quadrant pain    COMPARISON: None    FINDINGS:   Liver: Normal in size, contour and echotexture with no focal lesion  identified.   Pancreas: Visualized head is unremarkable.  Gallbladder: 2.4 cm mobile gallstone is present. Trace sludge.  Nonspecific borderline wall thickening measuring up to 4 mm. No  sonographic Murphy's sign. However, the patient was premedicated prior  to the study.  Bile Ducts: Normal. Common duct measures 5 mm.   Spleen: Normal.  Free Fluid: None.  Aorta: Normal.  IVC: Visualized IVC patent.  Kidneys: Normal size and echotexture with no obstruction. Right kidney  measures 10.7 cm. Left kidney measures 10.3 cm.      Impression:        1. A 2.4 cm gallstone is present. No specific evidence of cholecystitis.  However, the patient had pain medication prior to this study which may  limit evaluation for a sonographic  Murphy's sign.  2. No biliary ductal dilatation.    Carlynn Spry, MD   07/15/2019 5:40 PM    Chest AP Portable [[id number]] Collected: 07/15/19 1623    Order Status: Completed Updated: 07/15/19 1626    Narrative:      Reason for exam: 64 year old female with chest pain.    FINDINGS:  Portable AP view. 1610.  The heart size and contour are normal.  Lungs are clear with normal  pulmonary vascularity.  No pleural effusion, hilar or mediastinal  prominence is evident. There is no pneumothorax.      Impression:        No detectable active cardiopulmonary disease.        Wilmon Pali, MD   07/15/2019 4:24 PM           Signed by: Esmond Camper Janyia Guion

## 2019-07-16 NOTE — Transfer of Care (Signed)
Anesthesia Transfer of Care Note    Patient: Krista Gray    Procedures performed: Procedure(s):  LAPAROSCOPIC, CHOLECYSTECTOMY    Anesthesia type: General ETT    Patient location:Phase I PACU    Last vitals:   Vitals:    07/16/19 0735   BP: (!) 167/109   Pulse: 91   Resp: 16   Temp: 36.7 C (98.1 F)   SpO2: 91%       Post pain: Patient not complaining of pain, continue current therapy      Mental Status:awake    Respiratory Function: tolerating face mask    Cardiovascular: stable    Nausea/Vomiting: patient not complaining of nausea or vomiting    Hydration Status: adequate    Post assessment: no apparent anesthetic complications    Signed by: Janan Halter  07/16/19 9:55 AM

## 2019-07-16 NOTE — Discharge Instructions (Signed)
GENERAL DISCHARGE INSTRUCTIONS    You may not drive or do anything requiring coordination or balance for 24 hours.  Rest for the rest of the day.  Avoid heavy lifting for 2 weeks after any surgery.    You may not drink alcohol or consume non-prescribed sedatives or tranquilizers for 24 hours unless approved by your physician.    You should not sign important papers or make important decisions in the next 24 hours.    Please have someone responsible with you the first night you are home.    Keep your dressing/wound site clean and dry.  Do not remove the dressing until advised by your physician.  Wash your hands frequently before and after touching your surgical site.    Begin your diet with clear liquids and progress to your normal diet as long as you are not nauseated. It is suggested that you avoid greasy or spicy foods.    It is suggested that unless specified by the pharmacist, you take all medications with food.  All narcotic type pain medications can cause constipation, keep fluid intake up and increase fiber in your diet.    Things to call your surgeon for:  Persistent Nausea and Vomiting  Chills or a Fever above 101 degrees F  Persistent bleeding, swelling or pus at the operative site  Unable to urinate in 8 hours  Pain that is not relieved by the pain medication.

## 2019-07-17 ENCOUNTER — Encounter: Payer: Self-pay | Admitting: Surgery

## 2019-07-17 LAB — ECG 12-LEAD
Atrial Rate: 57 {beats}/min
P Axis: 53 degrees
P-R Interval: 138 ms
Q-T Interval: 446 ms
QRS Duration: 76 ms
QTC Calculation (Bezet): 434 ms
R Axis: 67 degrees
T Axis: 85 degrees
Ventricular Rate: 57 {beats}/min

## 2019-11-07 ENCOUNTER — Other Ambulatory Visit: Payer: Self-pay

## 2019-11-07 ENCOUNTER — Emergency Department (HOSPITAL_COMMUNITY): Payer: 59

## 2019-11-07 ENCOUNTER — Emergency Department (HOSPITAL_COMMUNITY)
Admission: EM | Admit: 2019-11-07 | Discharge: 2019-11-07 | Disposition: A | Discharge status: Home / Self Care | Payer: 59 | Attending: Emergency Medicine | Admitting: Emergency Medicine

## 2019-11-07 ENCOUNTER — Encounter (HOSPITAL_COMMUNITY): Payer: Self-pay

## 2019-11-07 DIAGNOSIS — Y9389 Activity, other specified: Secondary | ICD-10-CM | POA: Diagnosis not present

## 2019-11-07 DIAGNOSIS — S52021A Displaced fracture of olecranon process without intraarticular extension of right ulna, initial encounter for closed fracture: Secondary | ICD-10-CM | POA: Insufficient documentation

## 2019-11-07 DIAGNOSIS — S59901A Unspecified injury of right elbow, initial encounter: Secondary | ICD-10-CM | POA: Diagnosis present

## 2019-11-07 DIAGNOSIS — I10 Essential (primary) hypertension: Secondary | ICD-10-CM | POA: Insufficient documentation

## 2019-11-07 DIAGNOSIS — W182XXA Fall in (into) shower or empty bathtub, initial encounter: Secondary | ICD-10-CM | POA: Diagnosis not present

## 2019-11-07 DIAGNOSIS — Y999 Unspecified external cause status: Secondary | ICD-10-CM | POA: Insufficient documentation

## 2019-11-07 DIAGNOSIS — Y92002 Bathroom of unspecified non-institutional (private) residence single-family (private) house as the place of occurrence of the external cause: Secondary | ICD-10-CM | POA: Insufficient documentation

## 2019-11-07 HISTORY — DX: Conversion disorder with motor symptom or deficit: F44.4

## 2019-11-07 HISTORY — DX: Dorsalgia, unspecified: M54.9

## 2019-11-07 HISTORY — DX: Essential (primary) hypertension: I10

## 2019-11-07 HISTORY — DX: Depression, unspecified: F32.A

## 2019-11-07 MED ORDER — OXYCODONE-ACETAMINOPHEN 5-325 MG PO TABS: 1.0000 | ORAL_TABLET | Freq: Four times a day (QID) | ORAL | 0 refills | Status: AC | PRN

## 2019-11-07 NOTE — ED Triage Notes (Addendum)
Patient states she was getting out of a claw footed tub today and fell. patient c/o right elbow and right rib cage pain. Patient states the pain is worse with a deep breath. Patient states she took Ibuprofen 800 mg an hour ago.  Patient states she feels more sore in her right lateral breast area and does not want an x-ray of her right rib cage area.

## 2019-11-07 NOTE — ED Provider Notes (Signed)
[facility]-EMERGENCY DEPT Provider Note   CSN: [id number] Arrival date & time: 11/07/19  1036     History No chief complaint on file.   Stacey Walsh [place] is a 64 y.o. female.  HPI  Patient is a 64 year old female with past medical history of depression hypertension and chronic left knee pain.  She states that she was in the bathtub this morning when she attempted to get out of the bathtub and slipped and fell.  She states that she has chronic decreased flexion of her left knee which contributed to her fall today.  She denies any lightheadedness dizziness chest pain shortness of breath or headache.  She states she did not hit her head or have any headache or neck pain.  She states that she has pain in her right elbow which is achy, severe, nonradiating and located just over the elbow.  She endorses some bruising but denies any bleeding or abrasion or laceration.  She states that she struck the elbow against the floor when she fell.  She states she also has some right rib cage pain but would prefer not to have an x-ray done at this time.  She feels that it is located more in her breast.      Past Medical History:  Diagnosis Date  . Back pain   . Depression   . Dissociative neurological symptom disorder with tremor   . Hypertension     There are no problems to display for this patient.   Past Surgical History:  Procedure Laterality Date  . BREAST SURGERY    . CESAREAN SECTION    . CHOLECYSTECTOMY    . tummy tuck       OB History   No obstetric history on file.     Family History  Problem Relation Age of Onset  . Hypertension Mother   . Diabetes Mother   . Stroke Mother   . Hypertension Father   . Diabetes Father     Social History   Tobacco Use  . Smoking status: Never Smoker  . Smokeless tobacco: Never Used  Vaping Use  . Vaping Use: Never used  Substance Use Topics  . Alcohol use: Yes  . Drug use: Never    Home Medications Prior to  Admission medications   Medication Sig Start Date End Date Taking? Authorizing Provider  oxyCODONE-acetaminophen (PERCOCET/ROXICET) 5-325 MG tablet Take 1 tablet by mouth every 6 (six) hours as needed for severe pain. 11/07/19   Gailen Shelter, PA    Allergies    Patient has no known allergies.  Review of Systems   Review of Systems  Constitutional: Negative for chills and fever.  HENT: Negative for congestion.   Eyes: Negative for pain.  Respiratory: Negative for cough and shortness of breath.   Cardiovascular: Negative for chest pain and leg swelling.  Gastrointestinal: Negative for abdominal pain and vomiting.  Genitourinary: Negative for dysuria.  Musculoskeletal: Negative for myalgias.       Right rib cage pain, right elbow pain  Skin: Negative for rash.  Neurological: Negative for dizziness and headaches.    Physical Exam Updated Vital Signs BP (!) 149/97 (BP Location: Left Arm)   Pulse 85   Temp 98.5 F (36.9 C) (Oral)   Resp 16   Ht 5\' 6"  (1.676 m)   Wt 86.2 kg   SpO2 95%   BMI 30.67 kg/m   Physical Exam Vitals and nursing note reviewed.  Constitutional:  General: She is not in acute distress. HENT:     Head: Normocephalic and atraumatic.     Comments: No evidence of injury.    Nose: Nose normal.     Mouth/Throat:     Mouth: Mucous membranes are moist.  Eyes:     General: No scleral icterus. Neck:     Comments: No midline neck tenderness patient Cardiovascular:     Rate and Rhythm: Normal rate and regular rhythm.     Pulses: Normal pulses.     Heart sounds: Normal heart sounds.  Pulmonary:     Effort: Pulmonary effort is normal. No respiratory distress.     Breath sounds: Normal breath sounds. No wheezing.     Comments: Right-sided reproducible rib TTP Abdominal:     Palpations: Abdomen is soft.     Tenderness: There is no abdominal tenderness.  Musculoskeletal:     Cervical back: Normal range of motion.     Right lower leg: No edema.      Left lower leg: No edema.     Comments: PalpationBruising and swelling with the right olecranon.  Range of motion not assessed at this time due to my concern for fracture.  Distal pulses 3+ and symmetric radial pulses.  Skin:    General: Skin is warm and dry.     Capillary Refill: Capillary refill takes less than 2 seconds.  Neurological:     Mental Status: She is alert. Mental status is at baseline.     Comments: Sensation intact bilateral upper extremities the radial medial and ulnar distribution.  Psychiatric:        Mood and Affect: Mood normal.        Behavior: Behavior normal.     ED Results / Procedures / Treatments   Labs (all labs ordered are listed, but only abnormal results are displayed) Labs Reviewed - No data to display  EKG None  Radiology DG Ribs Unilateral W/Chest Right  Result Date: 11/07/2019 CLINICAL DATA:  Fall, rib pain EXAM: RIGHT RIBS AND CHEST - 3+ VIEW COMPARISON:  None. FINDINGS: No fracture or other bone lesions are seen involving the ribs. There is no evidence of pneumothorax or pleural effusion. Both lungs are clear. Heart size and mediastinal contours are within normal limits. IMPRESSION: No displaced fracture or other radiographic abnormality of the ribs. Electronically Signed   By: Lauralyn Primes M.D.   On: 11/07/2019 12:50   DG Elbow Complete Right  Result Date: 11/07/2019 CLINICAL DATA:  Right elbow injury today. EXAM: RIGHT ELBOW - COMPLETE 3+ VIEW COMPARISON:  None. FINDINGS: Severely displaced fracture is seen involving the olecranon. The visualized portions of the radius and humerus are unremarkable. IMPRESSION: Severely displaced olecranon fracture. Electronically Signed   By: Lupita Raider M.D.   On: 11/07/2019 11:59    Procedures Procedures (including critical care time) SPLINT APPLICATION Date/Time: 1:42 PM Authorized by: Gailen Shelter Consent: Verbal consent obtained. Risks and benefits: risks, benefits and alternatives were  discussed Consent given by: patient Splint applied by: orthopedic technician Location details: right elbow Splint type: posterior elbow at 105 degrees Supplies used: ace wrap  Post-procedure: The splinted body part was neurovascularly unchanged following the procedure. Patient tolerance: Patient tolerated the procedure well with no immediate complications.    Medications Ordered in ED Medications - No data to display  ED Course  I have reviewed the triage vital signs and the nursing notes.  Pertinent labs & imaging results that were available during my  care of the patient were reviewed by me and considered in my medical decision making (see chart for details).  Patient is a 64 year old female with mechanical fall that occurred earlier today she fell and hit her right rib cage and breast area as well as her right elbow.  She states that she has severe pain.  She has taken ibuprofen prior to arrival with some relief.  Patient denies any chest pain or shortness of breath.  Physical exam is notable for right-sided chest wall tenderness to palpation.  Patient also does have examination of right elbow that is concerning for fracture.  Will obtain x-rays of chest and rib cage and right elbow.  Clinical Course as of Nov 07 1338  Fri Nov 07, 2019  1225 Discussed w/ Dr. Victorino Dike of orthopedic surgery who recommended 105 degree (vs 90) extension splint with posterior splint and follow up in clinic. Ribs xray pending at this time.    [WF]  1302 Left elbow x-ray independently read by myself.  I agree with radiologist read that there is a severely displaced left olecranon fracture.  Rib x-rays and chest x-ray without any evidence of pneumothorax or rib fracture.   [WF]    Clinical Course User Index [WF] Gailen Shelter, [place]   MDM Rules/Calculators/A&P                          Patient discharged with close follow-up instructions with an orthopedic doctor.  She is from out of town and she will  follow-up with her PCP who will recommend an orthopedic doctor at home.  She is placed in splint I observe this process and ensure that it was placed correctly and she is distally neurovascularly intact after splint placement.  Pain is well controlled at this time.  Will discharge with Percocet for pain as needed she was told that it would make her dizzy and lightheaded because of the higher risk for weakness.   Final Clinical Impression(s) / ED Diagnoses Final diagnoses:  Closed fracture of olecranon process of right ulna, initial encounter    Rx / DC Orders ED Discharge Orders         Ordered    oxyCODONE-acetaminophen (PERCOCET/ROXICET) 5-325 MG tablet  Every 6 hours PRN     Discontinue  Reprint     11/07/19 1338           Gailen Shelter, [place] 11/07/19 1343    Pollyann Savoy, MD 11/07/19 1455

## 2019-11-07 NOTE — Discharge Instructions (Addendum)
Please wear your splint at all times.  Please follow-up with a orthopedic doctor within the next week.  Please rest ice and elevate. Please use Tylenol or ibuprofen for pain.  You may use 600 mg ibuprofen every 6 hours or 1000 mg of Tylenol every 6 hours.  You may choose to alternate between the 2.  This would be most effective.  Not to exceed 4 g of Tylenol within 24 hours.  Not to exceed 3200 mg ibuprofen 24 hours.  Percocet for breakthrough pain.  It will make you drowsy and dizzy.  Please make sure that you are in a safe place and not at risk for falling when you take this medication.

## 2021-11-01 IMAGING — CR DG RIBS W/ CHEST 3+V*R*
3 series · 3 of 3 positions shown · non-contrast
Comparison: None.

CLINICAL DATA: Fall, rib pain

EXAM:
RIGHT RIBS AND CHEST - 3+ VIEW

[w chest pa]
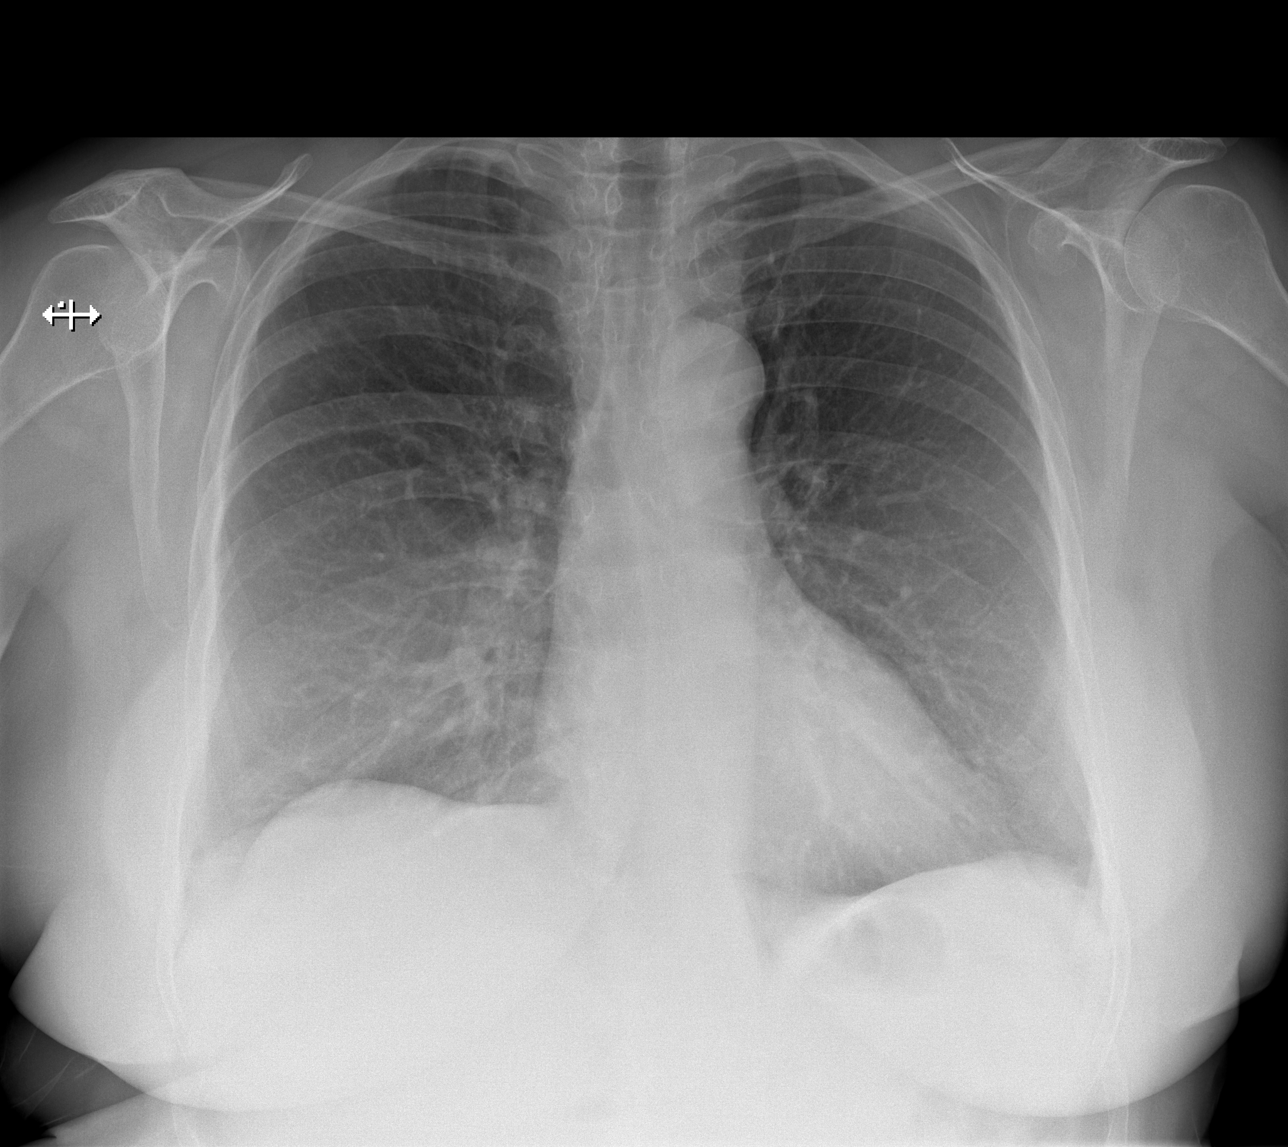

[w ribs ap upper right]
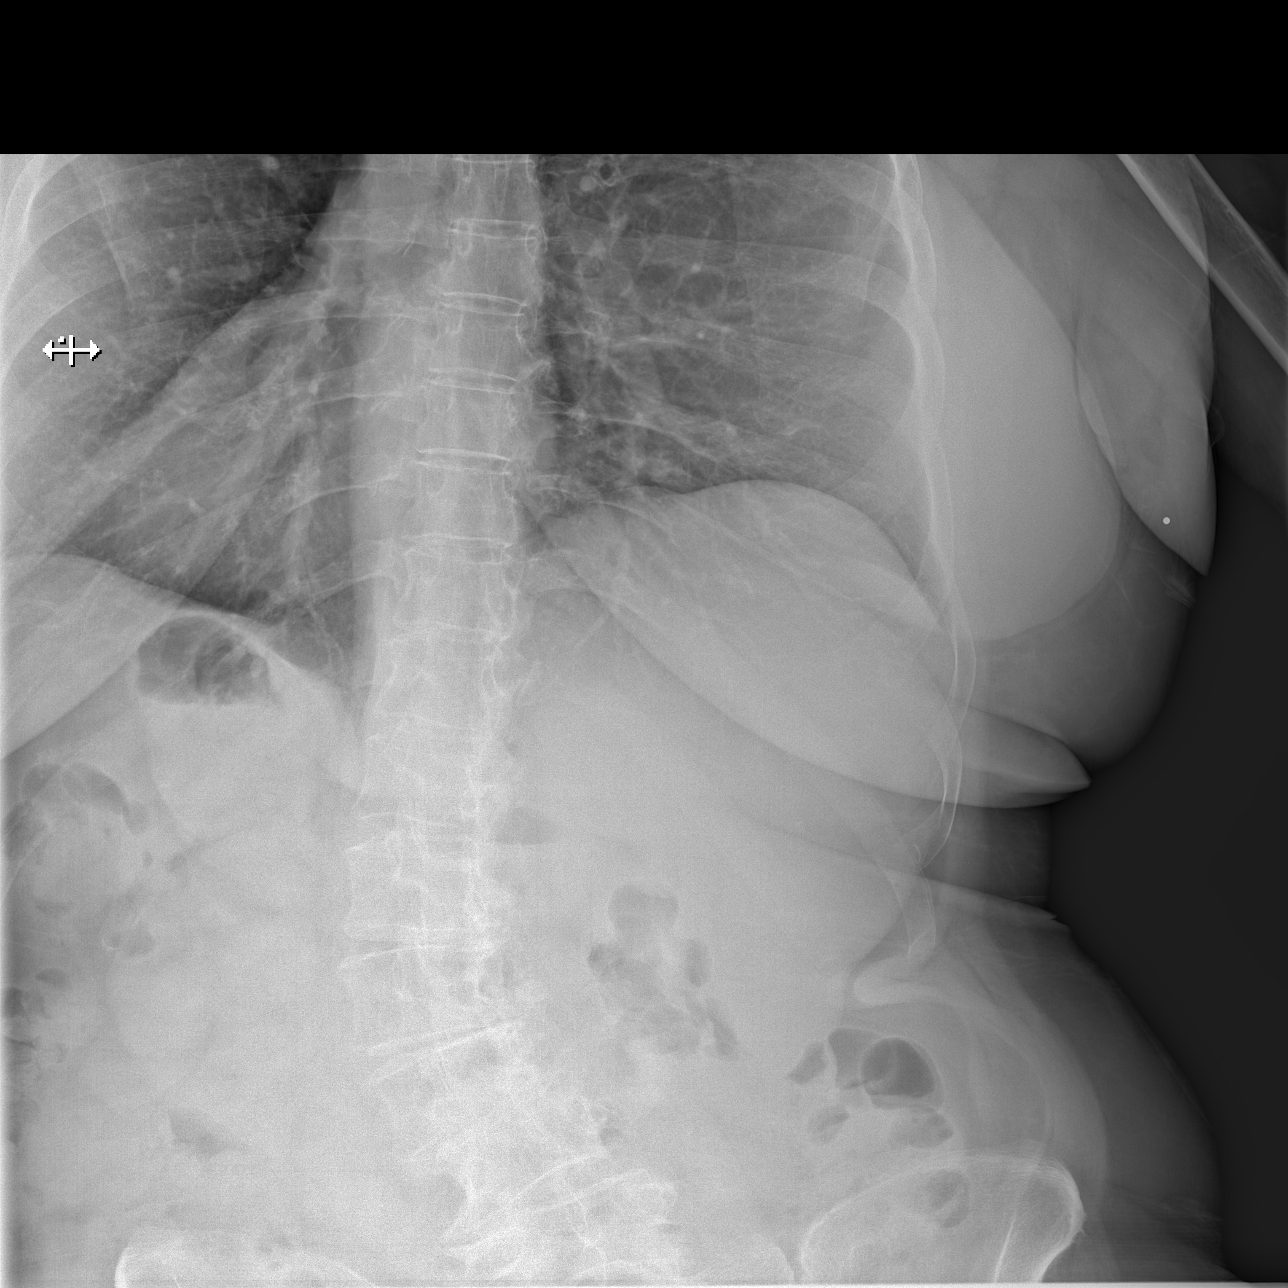

[w ribs obl right]
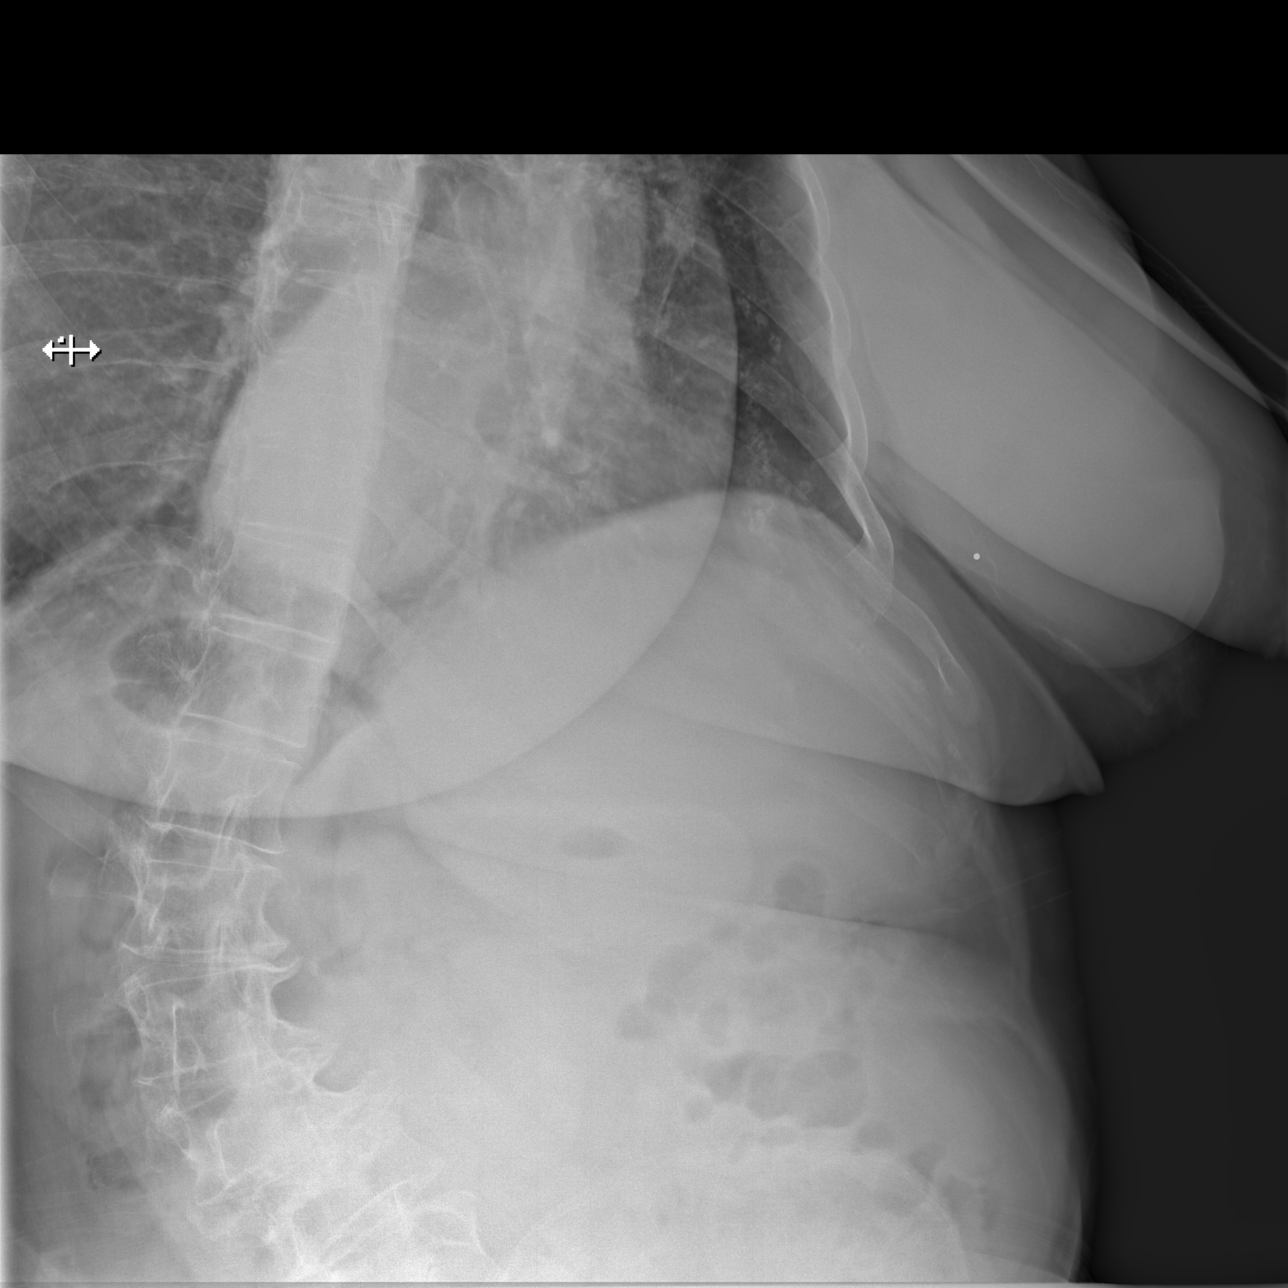

[3 of 3 positions shown; findings below may reference images not displayed]

FINDINGS: No fracture or other bone lesions are seen involving the ribs. There
is no evidence of pneumothorax or pleural effusion. Both lungs are
clear. Heart size and mediastinal contours are within normal limits.
IMPRESSION: No displaced fracture or other radiographic abnormality of the ribs.
